# Patient Record
Sex: Male | Born: 1990
Health system: Southern US, Community
[De-identification: ages and names within clinical notes are randomized; demographics above are authoritative.]

## PROBLEM LIST (undated history)

## (undated) DIAGNOSIS — R569 Unspecified convulsions: Secondary | ICD-10-CM

---

## 2012-05-24 ENCOUNTER — Other Ambulatory Visit: Payer: Self-pay | Admitting: Neurology

## 2012-05-24 DIAGNOSIS — R569 Unspecified convulsions: Secondary | ICD-10-CM

## 2012-05-30 ENCOUNTER — Ambulatory Visit (HOSPITAL_COMMUNITY)
Admission: RE | Admit: 2012-05-30 | Discharge: 2012-05-30 | Disposition: A | Discharge status: Home / Self Care | Payer: BC Managed Care – PPO | Source: Ambulatory Visit | Attending: Neurology | Admitting: Neurology

## 2012-05-30 DIAGNOSIS — R569 Unspecified convulsions: Secondary | ICD-10-CM | POA: Insufficient documentation

## 2012-05-30 DIAGNOSIS — J329 Chronic sinusitis, unspecified: Secondary | ICD-10-CM | POA: Insufficient documentation

## 2012-05-30 MED ORDER — GADOBENATE DIMEGLUMINE 529 MG/ML IV SOLN: 15.0000 mL | Freq: Once | INTRAVENOUS | Status: AC | PRN

## 2020-03-05 ENCOUNTER — Inpatient Hospital Stay (HOSPITAL_COMMUNITY)
Admission: EM | Admit: 2020-03-05 | Discharge: 2020-03-09 | DRG: 100 | Disposition: A | Discharge status: Home / Self Care | Payer: Self-pay | Attending: Family Medicine | Admitting: Family Medicine

## 2020-03-05 ENCOUNTER — Other Ambulatory Visit: Payer: Self-pay

## 2020-03-05 ENCOUNTER — Emergency Department (HOSPITAL_COMMUNITY): Payer: Self-pay

## 2020-03-05 ENCOUNTER — Encounter (HOSPITAL_COMMUNITY): Payer: Self-pay

## 2020-03-05 DIAGNOSIS — S0081XA Abrasion of other part of head, initial encounter: Secondary | ICD-10-CM | POA: Diagnosis present

## 2020-03-05 DIAGNOSIS — R809 Proteinuria, unspecified: Secondary | ICD-10-CM | POA: Diagnosis present

## 2020-03-05 DIAGNOSIS — G40409 Other generalized epilepsy and epileptic syndromes, not intractable, without status epilepticus: Secondary | ICD-10-CM | POA: Diagnosis present

## 2020-03-05 DIAGNOSIS — Z820 Family history of epilepsy and other diseases of the nervous system: Secondary | ICD-10-CM

## 2020-03-05 DIAGNOSIS — D72825 Bandemia: Secondary | ICD-10-CM | POA: Diagnosis present

## 2020-03-05 DIAGNOSIS — D582 Other hemoglobinopathies: Secondary | ICD-10-CM | POA: Diagnosis present

## 2020-03-05 DIAGNOSIS — N182 Chronic kidney disease, stage 2 (mild): Secondary | ICD-10-CM | POA: Diagnosis present

## 2020-03-05 DIAGNOSIS — E860 Dehydration: Secondary | ICD-10-CM | POA: Diagnosis present

## 2020-03-05 DIAGNOSIS — D75839 Thrombocytosis, unspecified: Secondary | ICD-10-CM | POA: Diagnosis present

## 2020-03-05 DIAGNOSIS — M6282 Rhabdomyolysis: Secondary | ICD-10-CM | POA: Diagnosis present

## 2020-03-05 DIAGNOSIS — Z20822 Contact with and (suspected) exposure to covid-19: Secondary | ICD-10-CM | POA: Diagnosis present

## 2020-03-05 DIAGNOSIS — N170 Acute kidney failure with tubular necrosis: Secondary | ICD-10-CM | POA: Diagnosis present

## 2020-03-05 DIAGNOSIS — R17 Unspecified jaundice: Secondary | ICD-10-CM | POA: Diagnosis present

## 2020-03-05 DIAGNOSIS — R000 Tachycardia, unspecified: Secondary | ICD-10-CM | POA: Diagnosis present

## 2020-03-05 DIAGNOSIS — R4182 Altered mental status, unspecified: Secondary | ICD-10-CM

## 2020-03-05 DIAGNOSIS — E878 Other disorders of electrolyte and fluid balance, not elsewhere classified: Secondary | ICD-10-CM | POA: Diagnosis present

## 2020-03-05 DIAGNOSIS — G40909 Epilepsy, unspecified, not intractable, without status epilepticus: Principal | ICD-10-CM | POA: Diagnosis present

## 2020-03-05 DIAGNOSIS — E8729 Other acidosis: Secondary | ICD-10-CM | POA: Diagnosis present

## 2020-03-05 DIAGNOSIS — R739 Hyperglycemia, unspecified: Secondary | ICD-10-CM | POA: Diagnosis not present

## 2020-03-05 DIAGNOSIS — R569 Unspecified convulsions: Secondary | ICD-10-CM

## 2020-03-05 DIAGNOSIS — E872 Acidosis: Secondary | ICD-10-CM | POA: Diagnosis present

## 2020-03-05 DIAGNOSIS — D473 Essential (hemorrhagic) thrombocythemia: Secondary | ICD-10-CM | POA: Diagnosis present

## 2020-03-05 DIAGNOSIS — N179 Acute kidney failure, unspecified: Secondary | ICD-10-CM

## 2020-03-05 DIAGNOSIS — R319 Hematuria, unspecified: Secondary | ICD-10-CM | POA: Diagnosis present

## 2020-03-05 DIAGNOSIS — Z23 Encounter for immunization: Secondary | ICD-10-CM

## 2020-03-05 DIAGNOSIS — T07XXXA Unspecified multiple injuries, initial encounter: Secondary | ICD-10-CM | POA: Diagnosis present

## 2020-03-05 DIAGNOSIS — D72829 Elevated white blood cell count, unspecified: Secondary | ICD-10-CM | POA: Diagnosis present

## 2020-03-05 HISTORY — DX: Unspecified convulsions: R56.9

## 2020-03-05 LAB — BASIC METABOLIC PANEL
Anion gap: 25 — ABNORMAL HIGH (ref 5–15)
BUN: 9 mg/dL (ref 6–20)
CO2: 9 mmol/L — ABNORMAL LOW (ref 22–32)
Calcium: 9.4 mg/dL (ref 8.9–10.3)
Chloride: 107 mmol/L (ref 98–111)
Creatinine, Ser: 1.49 mg/dL — ABNORMAL HIGH (ref 0.61–1.24)
GFR calc Af Amer: >60 mL/min (ref >60)
GFR calc non Af Amer: >60 mL/min (ref >60)
Glucose, Bld: 152 mg/dL — ABNORMAL HIGH (ref 70–99)
Potassium: 4.1 mmol/L (ref 3.5–5.1)
Sodium: 141 mmol/L (ref 135–145)

## 2020-03-05 LAB — URINALYSIS, ROUTINE W REFLEX MICROSCOPIC
Bacteria, UA: NONE SEEN
Bilirubin Urine: NEGATIVE
Glucose, UA: 50 mg/dL — AB
Ketones, ur: NEGATIVE mg/dL
Leukocytes,Ua: NEGATIVE
Nitrite: NEGATIVE
Protein, ur: 100 mg/dL — AB
Specific Gravity, Urine: 1.015 (ref 1.005–1.030)
pH: 5 (ref 5.0–8.0)

## 2020-03-05 LAB — RAPID URINE DRUG SCREEN, HOSP PERFORMED
Amphetamines: NOT DETECTED
Barbiturates: NOT DETECTED
Benzodiazepines: NOT DETECTED
Cocaine: NOT DETECTED
Opiates: NOT DETECTED
Tetrahydrocannabinol: POSITIVE — AB

## 2020-03-05 LAB — CBG MONITORING, ED
Glucose-Capillary: 139 mg/dL — ABNORMAL HIGH (ref 70–99)
Glucose-Capillary: 99 mg/dL (ref 70–99)

## 2020-03-05 LAB — CBC WITH DIFFERENTIAL/PLATELET
Abs Immature Granulocytes: 0.19 10*3/uL — ABNORMAL HIGH (ref 0.00–0.07)
Basophils Absolute: 0 10*3/uL (ref 0.0–0.1)
Basophils Relative: 0 %
Eosinophils Absolute: 0.1 10*3/uL (ref 0.0–0.5)
Eosinophils Relative: 1 %
HCT: 54.4 % — ABNORMAL HIGH (ref 39.0–52.0)
Hemoglobin: 17.3 g/dL — ABNORMAL HIGH (ref 13.0–17.0)
Immature Granulocytes: 2 %
Lymphocytes Relative: 15 %
Lymphs Abs: 1.9 10*3/uL (ref 0.7–4.0)
MCH: 29.5 pg (ref 26.0–34.0)
MCHC: 31.8 g/dL (ref 30.0–36.0)
MCV: 92.8 fL (ref 80.0–100.0)
Monocytes Absolute: 0.9 10*3/uL (ref 0.1–1.0)
Monocytes Relative: 7 %
Neutro Abs: 9.5 10*3/uL — ABNORMAL HIGH (ref 1.7–7.7)
Neutrophils Relative %: 75 %
Platelets: 429 10*3/uL — ABNORMAL HIGH (ref 150–400)
RBC: 5.86 MIL/uL — ABNORMAL HIGH (ref 4.22–5.81)
RDW: 11.9 % (ref 11.5–15.5)
WBC: 12.6 10*3/uL — ABNORMAL HIGH (ref 4.0–10.5)
nRBC: 0 % (ref 0.0–0.2)

## 2020-03-05 LAB — RESPIRATORY PANEL BY RT PCR (FLU A&B, COVID)
Influenza A by PCR: NEGATIVE
Influenza B by PCR: NEGATIVE
SARS Coronavirus 2 by RT PCR: NEGATIVE

## 2020-03-05 LAB — CK: Total CK: 302 U/L (ref 49–397)

## 2020-03-05 LAB — ETHANOL: Alcohol, Ethyl (B): <10 mg/dL (ref <10)

## 2020-03-05 LAB — HIV ANTIBODY (ROUTINE TESTING W REFLEX): HIV Screen 4th Generation wRfx: NONREACTIVE

## 2020-03-05 MED ORDER — LORAZEPAM 2 MG/ML IJ SOLN
INTRAMUSCULAR | Status: AC
  Filled 2020-03-05: qty 1

## 2020-03-05 MED ORDER — SODIUM CHLORIDE 0.9 % IV SOLN: INTRAVENOUS | Status: DC

## 2020-03-05 MED ORDER — TETANUS-DIPHTHERIA TOXOIDS TD 5-2 LFU IM INJ: 0.5000 mL | INJECTION | Freq: Once | INTRAMUSCULAR | Status: DC

## 2020-03-05 MED ORDER — LORAZEPAM 2 MG/ML IJ SOLN: 2.0000 mg | INTRAMUSCULAR | Status: DC | PRN

## 2020-03-05 MED ORDER — ENOXAPARIN SODIUM 40 MG/0.4ML ~~LOC~~ SOLN: 40.0000 mg | SUBCUTANEOUS | Status: DC

## 2020-03-05 MED ORDER — LEVETIRACETAM 500 MG PO TABS
500.0000 mg | ORAL_TABLET | Freq: Two times a day (BID) | ORAL | Status: DC
  Administered 2020-03-05 – 2020-03-09 (×8): 500 mg via ORAL
  Filled 2020-03-05 (×8): qty 1

## 2020-03-05 MED ORDER — LORAZEPAM 2 MG/ML IJ SOLN
INTRAMUSCULAR | Status: AC
  Administered 2020-03-05: 11:00:00 2 mg
  Filled 2020-03-05: qty 1

## 2020-03-05 MED ORDER — TETANUS-DIPHTH-ACELL PERTUSSIS 5-2.5-18.5 LF-MCG/0.5 IM SUSP
0.5000 mL | Freq: Once | INTRAMUSCULAR | Status: AC
  Administered 2020-03-05: 0.5 mL via INTRAMUSCULAR
  Filled 2020-03-05: qty 0.5

## 2020-03-05 MED ORDER — ACETAMINOPHEN 650 MG RE SUPP: 650.0000 mg | Freq: Four times a day (QID) | RECTAL | Status: DC | PRN

## 2020-03-05 MED ORDER — LEVETIRACETAM IN NACL 1000 MG/100ML IV SOLN
1000.0000 mg | Freq: Once | INTRAVENOUS | Status: AC
  Administered 2020-03-05: 1000 mg via INTRAVENOUS
  Filled 2020-03-05: qty 100

## 2020-03-05 MED ORDER — SODIUM CHLORIDE 0.9 % IV BOLUS: 500.0000 mL | Freq: Once | INTRAVENOUS | Status: DC

## 2020-03-05 MED ORDER — ACETAMINOPHEN 325 MG PO TABS: 650.0000 mg | ORAL_TABLET | Freq: Four times a day (QID) | ORAL | Status: DC | PRN

## 2020-03-05 MED ORDER — SODIUM CHLORIDE 0.9 % IV BOLUS
1000.0000 mL | Freq: Once | INTRAVENOUS | Status: AC
  Administered 2020-03-05: 1000 mL via INTRAVENOUS

## 2020-03-05 MED ORDER — SODIUM CHLORIDE 0.9 % IV SOLN: INTRAVENOUS | Status: AC

## 2020-03-05 NOTE — ED Notes (Signed)
Pt drank a little milk, laid flat and projectile vomited on bed while this RN was in room. Most caught in emesis bag. Pt threw up 3 times and now stopped and is resting comfortably. Bed linen changed

## 2020-03-05 NOTE — ED Provider Notes (Signed)
[facility] EMERGENCY DEPARTMENT Provider Note   CSN: [id number] Arrival date & time: 03/05/20  1019     History Chief Complaint  Patient presents with  . Seizures    Victor Franklin is a 29 y.o. male.  HPI 29 year old male with a previous history of seizures but no other medical history presents to the ER via EMS after a witnessed seizure while walking on the street.  History provided by patient who is alert and oriented.  He states he was walking in the street alone, when he put his head up against the wall because he started to feel unwell.  EMS reported that the patient fell and hit the left side of his head.  Seizure was witnessed by a bystander who called EMS.  Unclear if the seizure was tonic-clonic.  Seizure lasted approximately less than 1 minute per the witness.  EMS brought the patient in a c-collar.  Patient states that he is not on any medications for seizures and that his last seizure was approximately 2 years ago.  He is not aware as to why he is not taking any medications for this.  He denies having any neck pain, headaches, fevers, back pain at this time.  Denies use of drugs or alcohol abuse. Denies post-ictal stage. Unaware of what triggers his seizures. No history of CVA, developmental delay. He does have a family history of seizures.  Endorses drinking alcohol.  He has multiple abrasions to the left side of his face.   After finishing interviewing the patient, as I was walking out of the room, the nurse who was putting in an IV  called me to bedside as the patient began to have a tonic-clonic seizure.  Patient was given 2 mg of Ativan via IV and seizure activity ceased.  Patient was diaphoretic, tachycardic, post ictal, immobilized in [place] collar.  He was given another 2 mg of Ativan, 1 mg of Keppra and UA, alcohol level, drug screen, BMP and sent to CT scan.   After the witnessed seizure, his mother is at bedside who provided some additional  information.  First onset of seizures was at age 89.  He was taken for neurology work-up, MRI performed in 2013 which was negative.  She reports that it was decided that he would not start any medications. She states her sister has had one seizure in her entire life.       Past Medical History:  Diagnosis Date  . Seizures ([place])     There are no problems to display for this patient.   History reviewed. No pertinent surgical history.     No family history on file.  Social History   Tobacco Use  . Smoking status: Never Smoker  . Smokeless tobacco: Never Used  Substance Use Topics  . Alcohol use: Not Currently  . Drug use: Never    Home Medications Prior to Admission medications   Not on File    Allergies    Patient has no known allergies.  Review of Systems   Review of Systems  Constitutional: Negative for chills, diaphoresis, fatigue and fever.  HENT: Negative for ear pain, mouth sores, sore throat and trouble swallowing.   Eyes: Negative for pain.  Respiratory: Negative for cough, chest tightness and shortness of breath.   Cardiovascular: Negative for chest pain and palpitations.  Gastrointestinal: Negative for abdominal pain, diarrhea, nausea and vomiting.  Genitourinary: Negative for enuresis.  Musculoskeletal: Negative for back pain, joint swelling, myalgias, neck pain  and neck stiffness.  Neurological: Positive for seizures. Negative for dizziness, weakness, light-headedness and headaches.  Psychiatric/Behavioral: Negative for confusion.  All other systems reviewed and are negative.   Physical Exam Updated Vital Signs BP 108/77   Pulse (!) 103   Temp 98 F (36.7 C) (Oral)   Resp (!) 21   Ht 5\' 7"  (1.702 m)   Wt 81.6 kg   SpO2 98%   BMI 28.19 kg/m   Physical Exam Vitals reviewed.  Constitutional:      Appearance: Normal appearance.  HENT:     Head: Normocephalic and atraumatic.     Mouth/Throat:     Comments: Bilateral bite marks on lateral  aspects of the tongue Eyes:     General:        Right eye: No discharge.        Left eye: No discharge.     Extraocular Movements: Extraocular movements intact.     Conjunctiva/sclera: Conjunctivae normal.  Cardiovascular:     Rate and Rhythm: Normal rate and regular rhythm.  Pulmonary:     Effort: Pulmonary effort is normal.     Breath sounds: Normal breath sounds.  Abdominal:     General: Abdomen is flat.     Palpations: Abdomen is soft.  Musculoskeletal:        General: No swelling, tenderness or signs of injury. Normal range of motion.     Cervical back: Normal range of motion and neck supple. No tenderness.  Skin:    Findings: Erythema and lesion present.     Comments: Multiple superficial abrasions to the left side of face, more specifically eyebrow, cheek  Neurological:     General: No focal deficit present.     Mental Status: He is alert and oriented to person, place, and time.     Cranial Nerves: No cranial nerve deficit.     Sensory: No sensory deficit.     Motor: No weakness.     Comments: Strength and sensation intact in upper and lower extremities. Cranial nerves intact. A&O x4  Psychiatric:        Mood and Affect: Mood normal.        Behavior: Behavior normal.     ED Results / Procedures / Treatments   Labs (all labs ordered are listed, but only abnormal results are displayed) Labs Reviewed  BASIC METABOLIC PANEL - Abnormal; Notable for the following components:      Result Value   CO2 9 (*)    Glucose, Bld 152 (*)    Creatinine, Ser 1.49 (*)    Anion gap 25 (*)    All other components within normal limits  CBC WITH DIFFERENTIAL/PLATELET - Abnormal; Notable for the following components:   WBC 12.6 (*)    RBC 5.86 (*)    Hemoglobin 17.3 (*)    HCT 54.4 (*)    Platelets 429 (*)    Neutro Abs 9.5 (*)    Abs Immature Granulocytes 0.19 (*)    All other components within normal limits  CBG MONITORING, ED - Abnormal; Notable for the following components:     Glucose-Capillary 139 (*)    All other components within normal limits  RESPIRATORY PANEL BY RT PCR (FLU A&B, COVID)  ETHANOL  RAPID URINE DRUG SCREEN, HOSP PERFORMED  URINALYSIS, ROUTINE W REFLEX MICROSCOPIC    EKG EKG Interpretation  Date/Time:  Tuesday March 05 2020 11:13:14 EDT Ventricular Rate:  118 PR Interval:    QRS Duration: 99 QT Interval:  339 QTC Calculation: 475 R Axis:   81 Text Interpretation: Sinus tachycardia Borderline prolonged QT interval Baseline wander in lead(s) II III aVL aVF V1 V2 V4 V6 No old tracing to compare Confirmed by Noemi Chapel [id number]) on 03/05/2020 11:45:08 AM   Radiology CT Head Wo Contrast  Result Date: 03/05/2020 CLINICAL DATA:  Acute seizure.  History of trauma. EXAM: CT HEAD WITHOUT CONTRAST CT CERVICAL SPINE WITHOUT CONTRAST TECHNIQUE: Multidetector CT imaging of the head and cervical spine was performed following the standard protocol without intravenous contrast. Multiplanar CT image reconstructions of the cervical spine were also generated. COMPARISON:  Head CT report 03/18/2013 FINDINGS: CT HEAD FINDINGS Brain: No evidence of acute infarction, hemorrhage, hydrocephalus, extra-axial collection or mass lesion/mass effect. Vascular: No hyperdense vessel or unexpected calcification. Skull: Normal. Negative for fracture or focal lesion. Sinuses/Orbits: No acute finding. CT CERVICAL SPINE FINDINGS Alignment: Normal. Skull base and vertebrae: No acute fracture. No primary bone lesion or focal pathologic process. Soft tissues and spinal canal: No prevertebral fluid or swelling. No visible canal hematoma. Disc levels:  No degenerative changes Upper chest: Negative Other: Carious left posterior molar with periapical erosion. IMPRESSION: No evidence of intracranial or cervical spine injury. Electronically Signed   By: Monte Fantasia M.D.   On: 03/05/2020 11:40   CT Cervical Spine Wo Contrast  Result Date: 03/05/2020 CLINICAL DATA:  Acute seizure.   History of trauma. EXAM: CT HEAD WITHOUT CONTRAST CT CERVICAL SPINE WITHOUT CONTRAST TECHNIQUE: Multidetector CT imaging of the head and cervical spine was performed following the standard protocol without intravenous contrast. Multiplanar CT image reconstructions of the cervical spine were also generated. COMPARISON:  Head CT report 03/18/2013 FINDINGS: CT HEAD FINDINGS Brain: No evidence of acute infarction, hemorrhage, hydrocephalus, extra-axial collection or mass lesion/mass effect. Vascular: No hyperdense vessel or unexpected calcification. Skull: Normal. Negative for fracture or focal lesion. Sinuses/Orbits: No acute finding. CT CERVICAL SPINE FINDINGS Alignment: Normal. Skull base and vertebrae: No acute fracture. No primary bone lesion or focal pathologic process. Soft tissues and spinal canal: No prevertebral fluid or swelling. No visible canal hematoma. Disc levels:  No degenerative changes Upper chest: Negative Other: Carious left posterior molar with periapical erosion. IMPRESSION: No evidence of intracranial or cervical spine injury. Electronically Signed   By: Monte Fantasia M.D.   On: 03/05/2020 11:40    Procedures Procedures (including critical care time)  Medications Ordered in ED Medications  LORazepam (ATIVAN) 2 MG/ML injection (has no administration in time range)  LORazepam (ATIVAN) 2 MG/ML injection (2 mg  Given 03/05/20 1100)  levETIRAcetam (KEPPRA) IVPB 1000 mg/100 mL premix (0 mg Intravenous Stopped 03/05/20 1143)    ED Course  I have reviewed the triage vital signs and the nursing notes.  Pertinent labs & imaging results that were available during my care of the patient were reviewed by me and considered in my medical decision making (see chart for details).  Clinical Course as of Mar 05 1430  Tue Mar 05, 2020  [id number] Basic metabolic panel [MB]    Clinical Course User Index [MB] Lyndel Safe   MDM Rules/Calculators/A&P                     29 year old male with  a history of seizures w/ no treatment presents via EMS via for a witnessed seizure and subsequently having a additional tonic-clonic seizure in the ER with a prolonged postictal state. On initial physical exam, patient was alert and oriented, able to  clearly recall the events leading up to and after the seizure.  He denied a postictal state.  Denied any neck pain, back pain, headaches, fevers.  Reported that he was not on any medications.  He did have several facial abrasions and bite marks on the lateral side of the tongue bilaterally, but otherwise physical exam was unremarkable.   I personally witnessed the second seizure, which was of general tonic-clonic nature.  To milligrams of Ativan was given to the patient, after which the seizure ceased.  Patient was immediately in a postictal state, and I called Dr. Sabra Heck to bedside.  He was placed in a [place] collar and sent for CT along with an additional 2 mg of Ativan and and 1 g of Keppra infusion.  Labs ordered including BMP, CBC, ethanol, urine drug screen, urinalysis.    CT negative for intracranial hemorrhage or fracture.  BMP with mildly elevated creatinine, no other electrolyte abnormalities.  CBC with mildly elevated WBCs, RBCs, hemoglobin, hematocrit, likely consistent in the setting of a seizure but not clinically significant. CBG 139, low concern for hypoglycemic seizure. No signs of meningitis. Pt referred that he is not on any medications, so concern for  medication induced seizure low.  Urinalysis, rapid urine drug screen, EtOH pending.   Patient has remained post ictal throughout course of ED stay.  Mother at bedside.  Has intermittent bouts of agitation.  Given multiple seizures in short succession, prolonged postictal state, and history of treatment with no prior treatment, I suspect he will need admission for further monitoring/medication management.  We will consult neuro and hospitalist group.  Neuro consulted, recommends  outpatient workup. Given continuous post ictal state and no prior treatment,   Dr. Sabra Heck consulted Family Medicine who will admit the patient for further observation and management of antiseizure medications to therapeutic levels.  Final Clinical Impression(s) / ED Diagnoses Final diagnoses:  Seizures ([place])  Altered mental status, unspecified altered mental status type    Rx / DC Orders ED Discharge Orders    None       Lyndel Safe 03/05/20 1434    Noemi Chapel, MD 03/06/20 [phone]

## 2020-03-05 NOTE — ED Notes (Signed)
Pt seen walking around room. Pt unhooked himself from monitor, took out R hand IV. Bleeding now controlled. Gave pt water and soda. No emesis or nausea reported.

## 2020-03-05 NOTE — H&P (Signed)
[facility] Admission History and Physical Service Pager: [phone]  Patient name: Victor Franklin Medical record number: [id number] Date of birth: 10-Dec-1990 Age: 29 y.o. Gender: male  Primary Care Provider: Caryl Bis, MD Consultants: None Code Status: Full Preferred Emergency Contact: Victor Franklin (mom) [phone]  Chief Complaint: Seizure, Fall  Assessment and Plan: Victor Franklin is a 29 y.o. male presenting with seizure activity. PMH is significant for tobacco use disorder, chronic marijuana smoker.   Seizure Disorder Patient was in normal state of health until earlier today when patient experienced multiple witnessed tonic clonic seizures both at work with fall that resulted in striking his head on an unknown object and another witnessed seizure during admission to ED. He was give 2mg  Ativan IV x2 and 1g of IV Keppra. Per patient, he has a history of seizure disorder that has never been treated. He had work up obtained in 2013 and normal MRI at that time and was not placed on any medications. Patient experienced loss of consciousness during these episodes and a post-ictal state that was slowly returning to baseline on admission. CT head and cervical spine was negative for hemorrhage or fracture. CBG and electrolytes WNL. Mild leukocytosis however given lack of other infectious symptoms, negative UA likely secondary to seizure. UDS positive for THC. EKG w/ sinus tachycardia and wandering baseline. Suspect seizure today was break through given lack of treatment for known seizure disorder. There are no other known triggers at this time but will continue to consider other causes. Neuro was consulted in the ED and recommended outpatient follow up. Will bring him in for observation overnight due to continued slow return to baseline and will continue Keppra PO. -Admit to FPTS, obs. Attending Dr. Sherren Mocha McDiarmid  -PO Keppra 500mg  BID starting tomorrow, if seizure over  night can give 500mg  PO/IV -Tylenol PRN -f/u ethanol lvl -f/u AM CBC, BMP -Seizure precautions -cardiac monitoring, pulse ox -vital sign per protocol  -repeat EKG am  Elevated glucose CBG 152 on admission. Now 99. Likely due to acute state of illness but will continue to monitor on AM BMP. -Monitor on AM BMP   Tobacco use disorder/ +THC/ Ethanol ingestion Chew tobacco and endorses daily marijuana use multiple times a day. Nicotine patch offered, patient declined at this time. Reported last drink was this past weekend and drinks socially.  -f/u Ethanol lvl -monitor for signs of alcohol withdrawal -consider re offering nicotine patch  FEN/GI: Regular diet Prophylaxis: Lovenox  Disposition: Med-Surg, OBS  History of Present Illness:  Victor Franklin is a 29 y.o. male presenting after experiencing a witnessed seizure while at work. Patient was working when he all of a sudden fell over against a wall. Per EMS/ED provider, seizure was witnessed by a bystander who called EMS. When he fell, he hit the left side of his forehead. He was noted to have an additional seizure while in the emergency room. The last thing he remembers prior to the seizure was "sitting down", then he remembers being surrounded by everyone. He denies any infectious symptoms prior to today. Denies any past medical history other than seizures - He has a history of seizures, first occurred in 2013. He had his last seizure in 2019. He had some work up back in 2013 at [facility] including [place]. He has never been on medications.  Last MRI in 2013 was normal.   Prior to admission patient had a witnessed tonic-clonic seizure by ED provider. He was given 2mg  of Ativan  IV which resolved the seizure. He was given 2mg  Ativan and 1g of Keppra. Neurology was consulted and did not feel admission was necessary given history and prior MRI that was normal. Recommended outpatient follow up.   Labs: CT head and cervical spine negative for  intracranial hemorrhage or fracture. BMP with normal electrolytes,. CBC with mild leukocytosis of 12.6, Hgb 17.3, Plt 429. Negative COVID and Flu, UA with mod Hgb and 100 protein, otherwise normal. UDS positive for THC.  CBG on admission 139.   Denies being on any medications. Last saw his PCP in 2019.   Alcohol: drinks on the weekends, last drink last weekend Tobacco: chews tobacco  Drugs: Marijuana - multiple times per day  No allergies.     Review Of Systems: Per HPI with the following additions:   Review of Systems  Constitutional: Negative for fever.  HENT: Negative for congestion.   Respiratory: Negative for cough and shortness of breath.   Gastrointestinal: Negative for abdominal pain, constipation, diarrhea, nausea and vomiting.  Genitourinary: Negative for dysuria.  Musculoskeletal: Positive for falls. Negative for back pain, joint pain and neck pain.  Neurological: Positive for seizures, loss of consciousness and weakness. Negative for focal weakness and headaches.  Psychiatric/Behavioral: Positive for substance abuse.    There are no problems to display for this patient.   Past Medical History: Past Medical History:  Diagnosis Date  . Seizures ([place])     Past Surgical History: History reviewed. No pertinent surgical history.  Social History: Social History   Tobacco Use  . Smoking status: Never Smoker  . Smokeless tobacco: Never Used  Substance Use Topics  . Alcohol use: Not Currently  . Drug use: Never   Additional social history: See above  Please also refer to relevant sections of EMR.  Family History: No family history on file.  Allergies and Medications: No Known Allergies No current facility-administered medications on file prior to encounter.   No current outpatient medications on file prior to encounter.    Objective: BP 108/77   Pulse (!) 103   Temp 98 F (36.7 C) (Oral)   Resp (!) 21   Ht 5\' 7"  (1.702 m)   Wt 81.6 kg   SpO2 98%    BMI 28.19 kg/m   Exam: General: Appears tired and lethargic. No acute distress. Appears older than stated age. Eyes: PERRL. No conjunctivae ENTM: Normal orophranyx. No missing teeth Neck: No visible thyroid enlargement Cardiovascular: RRR, no murmurs Respiratory: CTAB Gastrointestinal: soft, flat, no organomegaly, non tender, +BS MSK: 4/5 strength in U+L ext. Derm: Multiple abrasions on left lateral face Neuro: Alert but sluggish. Orient to person but not to place or time. CN II-XII grossly intact. No focal deficits.  Psych: Flat affect  Labs and Imaging: CBC BMET  Recent Labs  Lab 03/05/20 1145  WBC 12.6*  HGB 17.3*  HCT 54.4*  PLT 429*   Recent Labs  Lab 03/05/20 1145  NA 141  K 4.1  CL 107  CO2 9*  BUN 9  CREATININE 1.49*  GLUCOSE 152*  CALCIUM 9.4    UA w/ proteinuria, hgb, and glucose UDS +THC COVID neg CBG 139  EKG: Sinus tach: wandering baseline  CT HEAD WITHOUT CONTRAST CT CERVICAL SPINE WITHOUT CONTRAST COMPARISON:  Head CT report 03/18/2013 IMPRESSION: No evidence of intracranial or cervical spine injury.   Gerlene Fee, DO 03/05/2020, 1:54 PM PGY-1, [place] Intern pager: [phone], text pages welcome

## 2020-03-05 NOTE — ED Triage Notes (Addendum)
Pt brought in by EMS; pt walking down sidewalk, fell to concrete, had a full body seizure lasting less than 1 minute, witnessed by friend; hx seizures; not on seizure medication; alert to self and place at this time; abrasions to left head and face  130/76 HR 96 97% RA CBG 116

## 2020-03-05 NOTE — ED Provider Notes (Signed)
Medical screening examination/treatment/procedure(s) were conducted as a shared visit with non-physician practitioner(s) and myself.  I personally evaluated the patient during the encounter.  Clinical Impression:   Final diagnoses:  Seizures ([place])  Altered mental status, unspecified altered mental status type   This patient is an ill-appearing 29 year old male presenting with seizures.  Evidently there was a witnessed prehospital seizure, he had a fall, he struck the left side of his head, paramedics immobilized the patient in a cervical collar and transported him to the hospital.  On arrival the patient had normal mental status but very quickly went back into tonic-clonic seizure activity, 2 mg of Ativan were given IV with cessation of seizure activity.  On my exam the patient is diaphoretic, tachycardic and has evidence of head trauma on the left as well as tongue biting.  Immobilized in a [place] collar, sent for CT scan of the head and cervical spine, given more Ativan, 1 g of Keppra, metabolic panel will be ordered, alcohol level, drug screen.  The patient will be placed on a cardiac monitor, he is critically ill with intermittent ongoing seizures.  This patient needs to be admitted to the hospital for prolonged altered mental status, multiple recurrent seizures, after getting Keppra and Ativan the patient had no further seizures though he continued to be somnolent, CT scans negative for acute findings, neurology consulted, recommends therapy with antiseizure medications but due to prolonged postictal phase the patient will be admitted to the medical service  .Critical Care Performed by: Noemi Chapel, MD Authorized by: Noemi Chapel, MD   Critical care provider statement:    Critical care time (minutes):  35   Critical care time was exclusive of:  Separately billable procedures and treating other patients and teaching time   Critical care was necessary to treat or prevent imminent or  life-threatening deterioration of the following conditions:  CNS failure or compromise   Critical care was time spent personally by me on the following activities:  Blood draw for specimens, development of treatment plan with patient or surrogate, discussions with consultants, evaluation of patient's response to treatment, examination of patient, obtaining history from patient or surrogate, ordering and performing treatments and interventions, ordering and review of laboratory studies, ordering and review of radiographic studies, pulse oximetry, re-evaluation of patient's condition and review of old charts      Noemi Chapel, MD 03/06/20 [phone]

## 2020-03-06 ENCOUNTER — Encounter (HOSPITAL_COMMUNITY): Payer: Self-pay | Admitting: Family Medicine

## 2020-03-06 ENCOUNTER — Inpatient Hospital Stay (HOSPITAL_COMMUNITY): Payer: Self-pay

## 2020-03-06 DIAGNOSIS — R809 Proteinuria, unspecified: Secondary | ICD-10-CM | POA: Diagnosis present

## 2020-03-06 DIAGNOSIS — D473 Essential (hemorrhagic) thrombocythemia: Secondary | ICD-10-CM

## 2020-03-06 DIAGNOSIS — R17 Unspecified jaundice: Secondary | ICD-10-CM | POA: Diagnosis present

## 2020-03-06 DIAGNOSIS — R319 Hematuria, unspecified: Secondary | ICD-10-CM

## 2020-03-06 DIAGNOSIS — R569 Unspecified convulsions: Secondary | ICD-10-CM

## 2020-03-06 DIAGNOSIS — N179 Acute kidney failure, unspecified: Secondary | ICD-10-CM

## 2020-03-06 DIAGNOSIS — G40409 Other generalized epilepsy and epileptic syndromes, not intractable, without status epilepticus: Secondary | ICD-10-CM

## 2020-03-06 HISTORY — DX: Hematuria, unspecified: R31.9

## 2020-03-06 LAB — CBC WITH DIFFERENTIAL/PLATELET
Abs Immature Granulocytes: 0.03 10*3/uL (ref 0.00–0.07)
Basophils Absolute: 0 10*3/uL (ref 0.0–0.1)
Basophils Relative: 0 %
Eosinophils Absolute: 0 10*3/uL (ref 0.0–0.5)
Eosinophils Relative: 0 %
HCT: 45.4 % (ref 39.0–52.0)
Hemoglobin: 15.3 g/dL (ref 13.0–17.0)
Immature Granulocytes: 0 %
Lymphocytes Relative: 9 %
Lymphs Abs: 1 10*3/uL (ref 0.7–4.0)
MCH: 29.8 pg (ref 26.0–34.0)
MCHC: 33.7 g/dL (ref 30.0–36.0)
MCV: 88.3 fL (ref 80.0–100.0)
Monocytes Absolute: 1.3 10*3/uL — ABNORMAL HIGH (ref 0.1–1.0)
Monocytes Relative: 12 %
Neutro Abs: 8.7 10*3/uL — ABNORMAL HIGH (ref 1.7–7.7)
Neutrophils Relative %: 79 %
Platelets: 258 10*3/uL (ref 150–400)
RBC: 5.14 MIL/uL (ref 4.22–5.81)
RDW: 12 % (ref 11.5–15.5)
WBC: 11 10*3/uL — ABNORMAL HIGH (ref 4.0–10.5)
nRBC: 0 % (ref 0.0–0.2)

## 2020-03-06 LAB — BASIC METABOLIC PANEL
Anion gap: 9 (ref 5–15)
Anion gap: 10 (ref 5–15)
BUN: 14 mg/dL (ref 6–20)
BUN: 16 mg/dL (ref 6–20)
CO2: 22 mmol/L (ref 22–32)
CO2: 20 mmol/L — ABNORMAL LOW (ref 22–32)
Calcium: 8.8 mg/dL — ABNORMAL LOW (ref 8.9–10.3)
Calcium: 8.2 mg/dL — ABNORMAL LOW (ref 8.9–10.3)
Chloride: 106 mmol/L (ref 98–111)
Chloride: 110 mmol/L (ref 98–111)
Creatinine, Ser: 2.9 mg/dL — ABNORMAL HIGH (ref 0.61–1.24)
Creatinine, Ser: 3.86 mg/dL — ABNORMAL HIGH (ref 0.61–1.24)
GFR calc Af Amer: 33 mL/min — ABNORMAL LOW (ref >60)
GFR calc Af Amer: 23 mL/min — ABNORMAL LOW (ref >60)
GFR calc non Af Amer: 29 mL/min — ABNORMAL LOW (ref >60)
GFR calc non Af Amer: 20 mL/min — ABNORMAL LOW (ref >60)
Glucose, Bld: 105 mg/dL — ABNORMAL HIGH (ref 70–99)
Glucose, Bld: 105 mg/dL — ABNORMAL HIGH (ref 70–99)
Potassium: 3.6 mmol/L (ref 3.5–5.1)
Potassium: 3.7 mmol/L (ref 3.5–5.1)
Sodium: 137 mmol/L (ref 135–145)
Sodium: 140 mmol/L (ref 135–145)

## 2020-03-06 LAB — CREATININE, URINE, RANDOM: Creatinine, Urine: 78.18 mg/dL

## 2020-03-06 LAB — HEPATIC FUNCTION PANEL
ALT: 29 U/L (ref 0–44)
AST: 35 U/L (ref 15–41)
Albumin: 3.7 g/dL (ref 3.5–5.0)
Alkaline Phosphatase: 79 U/L (ref 38–126)
Bilirubin, Direct: 0.3 mg/dL — ABNORMAL HIGH (ref 0.0–0.2)
Indirect Bilirubin: 1.8 mg/dL — ABNORMAL HIGH (ref 0.3–0.9)
Total Bilirubin: 2.1 mg/dL — ABNORMAL HIGH (ref 0.3–1.2)
Total Protein: 6.4 g/dL — ABNORMAL LOW (ref 6.5–8.1)

## 2020-03-06 LAB — CK
Total CK: 1663 U/L — ABNORMAL HIGH (ref 49–397)
Total CK: 1486 U/L — ABNORMAL HIGH (ref 49–397)

## 2020-03-06 LAB — SODIUM, URINE, RANDOM: Sodium, Ur: 152 mmol/L

## 2020-03-06 MED ORDER — HEPARIN SODIUM (PORCINE) 5000 UNIT/ML IJ SOLN
5000.0000 [IU] | Freq: Three times a day (TID) | INTRAMUSCULAR | Status: DC
  Administered 2020-03-06 – 2020-03-09 (×8): 5000 [IU] via SUBCUTANEOUS
  Filled 2020-03-06 (×8): qty 1

## 2020-03-06 MED ORDER — WHITE PETROLATUM EX OINT
TOPICAL_OINTMENT | Freq: Two times a day (BID) | CUTANEOUS | Status: DC
  Administered 2020-03-06 – 2020-03-07 (×3): 0.2 via TOPICAL
  Filled 2020-03-06 (×2): qty 28.35

## 2020-03-06 MED ORDER — SODIUM CHLORIDE 0.9 % IV BOLUS
1000.0000 mL | Freq: Once | INTRAVENOUS | Status: AC
  Administered 2020-03-06: 1000 mL via INTRAVENOUS

## 2020-03-06 MED ORDER — SODIUM CHLORIDE 0.9 % IV SOLN: INTRAVENOUS | Status: DC

## 2020-03-06 MED ORDER — SODIUM CHLORIDE 0.9 % IV SOLN: INTRAVENOUS | Status: AC

## 2020-03-06 NOTE — Progress Notes (Addendum)
Briefly spoke with Dr. Johnney Ou, nephrology, who recommended staying the course as is. Did suggest decreasing IVF, will go to maintenance at 125 mL/hour.   Additionally attempted to update mother on plan of action, however both numbers within his chart are unavailable.  Will clarify phone numbers with patient tomorrow.  Patriciaann Clan, DO

## 2020-03-06 NOTE — Progress Notes (Signed)
Noted afternoon creatinine increased to 3.8 from 2.9 this morning with downtrending of CK to 1486.  Evaluated patient at bedside, doing well and has no complaints.  Urinating as normal.  A/P: Nonoliguric acute renal injury: Suspect multifactorial 2/2 ATN with heme pigment/rhabdomyolysis with component of dehydration, however degree of renal injury does seem to be out of proportion in relation to CK elevation.  May have component of underlying renal disease with history of significant NSAID use.  FeNa 2.1%, suggesting intrinsic (urine studies collected prior to IVF initiation, calculated with presenting Cr 1.5).   Will stay the course with aggressive IVF. In addition, will obtain a renal ultrasound, P/CR ratio, and repeat urinalysis.  If becomes oliguric or creatinine uptrends in the morning, will likely consult nephrology for additional assistance.  Patriciaann Clan, DO

## 2020-03-06 NOTE — Progress Notes (Addendum)
Patient's creatinine markedly elevated from admission yesterday.  2.9 from 1.49.  Other electrolytes are within normal limits.  Patient also has bilirubinemia this morning concerning for muscle breakdown likely in the setting of seizures.   AKI Pre-renal versus intrinsic pigment associated. Prerenal etiology possible 2/2 dehydration; however BUN:Cr not reflective of this. Can also consider Keppra, though rare per literature review and pt received 1g. No other abnormalities concerning for severe kidney injury at this time. Continue to monitor. No urine micro from urinalysis yesterday.  - will repeat CK -obtain FeNa -continue strict IO  - 1 L bolus + 250 maintenance  -if no UOP x 4 hours, bladder scan -recheck BMP 1600   Wilber Oliphant, M.D.  7:05 AM 03/06/2020

## 2020-03-06 NOTE — Progress Notes (Signed)
Family Medicine Teaching Service Daily Progress Note Intern Pager: [phone]  Patient name: Victor Franklin Medical record number: [id number] Date of birth: [birth year] Age: 29 y.o. Gender: male  Primary Care Provider: Caryl Bis, MD Consultants: None Code Status: FULL  Pt Overview and Major Events to Date:  4/8 Admitted  Assessment and Plan: Mang Tailor is a 29 y.o. male presenting with seizure activity. PMH is significant for tobacco use disorder, chronic marijuana smoker.   Tonic Clonic convulsions  c/f Rhabdomyolysis  No reported seizures overnight. Patient only concern is back soreness. Likely from his fall. CK [id number] and elevated creatinine. Suspect rhabdomyolysis likely due to seizures.    -PO Keppra 500mg  BID -NS 250 mL mIVF; consider increasing with repeat CK -Tylenol PRN -f/u PM CK -f/u AM CBC, BMP -Seizure precautions -cardiac monitoring, pulse ox -vital sign per protocol  -f/u repeat EKG  AKI Cr 2.90. Admission was 1.49. FENa is 4.1% suggesting post-renal/obstruction which would be unusual for a male his age. More likely related to dehydration/hemoconcentration, starting keppra. Patient endorses NSAID use (up to 10 800mg  tablet/day). -Monitor on BMP -Avoid nephrotoxic drugs  Thrombocytosis. Resolved. Plt 258. Admission 429. Thought to be due to acute state of illness -Monitor AM CBC  Multiple abrasions of left of face and left pinna  -Needs Tdap booster -Vaseline  Elevated glucose CBG 152 on admission. Now 105. Likely due to acute state of illness but will continue to monitor on AM BMP. -Monitor on AM BMP   Tobacco use disorder/ +THC/ Ethanol ingestion Chew tobacco and endorses daily marijuana use multiple times a day. Nicotine patch offered, patient declined at this time. Reported last drink was this past weekend and drinks socially.  -monitor for signs of alcohol withdrawal -consider re offering nicotine patch  FEN/GI: Regular  diet Prophylaxis: Lovenox  Disposition: Home pending medical clearance  Subjective:  He feels ok, has his memory back and has some mild back discomfort  Objective: Temp:  [98 F (36.7 C)] 98 F (36.7 C) (04/06 2341) Pulse Rate:  [69-121] 69 (04/06 2341) Resp:  [17-24] 17 (04/06 2341) BP: (105-138)/(64-90) 119/90 (04/06 2341) SpO2:  [95 %-99 %] 98 % (04/06 2341) Weight:  [81.6 kg] 81.6 kg (04/06 1027)  Physical Exam: General: Appears unwell, no acute distress. Appears older than stated age. Multiple abrasions on the left side of the face showing signs of healing. Cardiovascular: RRR, normal heart sounds, no murmurs Respiratory: CTAB, normal effort MSK: No CVA. Diffuse hypertonic back muscularture Extremities: Moves all ext. spontaneously  Laboratory: Recent Labs  Lab 03/05/20 1145 03/06/20 0316  WBC 12.6* 11.0*  HGB 17.3* 15.3  HCT 54.4* 45.4  PLT 429* 258   Recent Labs  Lab 03/05/20 1145 03/06/20 0316  NA 141 137  K 4.1 3.6  CL 107 106  CO2 9* 22  BUN 9 14  CREATININE 1.49* 2.90*  CALCIUM 9.4 8.8*  PROT  --  6.4*  BILITOT  --  2.1*  ALKPHOS  --  79  ALT  --  29  AST  --  35  GLUCOSE 152* 105*    Imaging/Diagnostic Tests: No new imaging.  Gerlene Fee, DO 03/06/2020, 7:17 AM PGY-1, [place] Intern pager: [phone], text pages welcome

## 2020-03-06 NOTE — Progress Notes (Signed)
FPTS Interim Progress Note Updated family at bedside which was mom and sister.  Reiterated that Victor Franklin will stay another night with [place] as his CK levels are elevated.  Mom would like to be called with repeat levels at 4 PM.  Both voiced understanding and pertinent questions were answered.  Gerlene Fee, DO 03/06/2020, 3:46 PM PGY-1, [place] Medicine Service pager [phone]

## 2020-03-06 NOTE — Plan of Care (Signed)

## 2020-03-07 LAB — CBC
HCT: 43.1 % (ref 39.0–52.0)
Hemoglobin: 14.7 g/dL (ref 13.0–17.0)
MCH: 30.1 pg (ref 26.0–34.0)
MCHC: 34.1 g/dL (ref 30.0–36.0)
MCV: 88.3 fL (ref 80.0–100.0)
Platelets: 234 10*3/uL (ref 150–400)
RBC: 4.88 MIL/uL (ref 4.22–5.81)
RDW: 12 % (ref 11.5–15.5)
WBC: 8.6 10*3/uL (ref 4.0–10.5)
nRBC: 0 % (ref 0.0–0.2)

## 2020-03-07 LAB — URINALYSIS, ROUTINE W REFLEX MICROSCOPIC
Bacteria, UA: NONE SEEN
Bilirubin Urine: NEGATIVE
Glucose, UA: NEGATIVE mg/dL
Ketones, ur: NEGATIVE mg/dL
Leukocytes,Ua: NEGATIVE
Nitrite: NEGATIVE
Protein, ur: NEGATIVE mg/dL
Specific Gravity, Urine: 1.004 — ABNORMAL LOW (ref 1.005–1.030)
pH: 6 (ref 5.0–8.0)

## 2020-03-07 LAB — PROTEIN / CREATININE RATIO, URINE
Creatinine, Urine: 74.26 mg/dL
Protein Creatinine Ratio: 0.13 mg/mg{Cre} (ref 0.00–0.15)
Total Protein, Urine: 10 mg/dL

## 2020-03-07 LAB — COMPREHENSIVE METABOLIC PANEL
ALT: 24 U/L (ref 0–44)
AST: 29 U/L (ref 15–41)
Albumin: 3.2 g/dL — ABNORMAL LOW (ref 3.5–5.0)
Alkaline Phosphatase: 62 U/L (ref 38–126)
Anion gap: 7 (ref 5–15)
BUN: 16 mg/dL (ref 6–20)
CO2: 20 mmol/L — ABNORMAL LOW (ref 22–32)
Calcium: 8.3 mg/dL — ABNORMAL LOW (ref 8.9–10.3)
Chloride: 114 mmol/L — ABNORMAL HIGH (ref 98–111)
Creatinine, Ser: 4.49 mg/dL — ABNORMAL HIGH (ref 0.61–1.24)
GFR calc Af Amer: 20 mL/min — ABNORMAL LOW (ref >60)
GFR calc non Af Amer: 17 mL/min — ABNORMAL LOW (ref >60)
Glucose, Bld: 98 mg/dL (ref 70–99)
Potassium: 4 mmol/L (ref 3.5–5.1)
Sodium: 141 mmol/L (ref 135–145)
Total Bilirubin: 2 mg/dL — ABNORMAL HIGH (ref 0.3–1.2)
Total Protein: 5.9 g/dL — ABNORMAL LOW (ref 6.5–8.1)

## 2020-03-07 LAB — CK: Total CK: 893 U/L — ABNORMAL HIGH (ref 49–397)

## 2020-03-07 MED ORDER — SODIUM CHLORIDE 0.9 % IV SOLN: INTRAVENOUS | Status: DC

## 2020-03-07 NOTE — Progress Notes (Signed)
Family Medicine Teaching Service Daily Progress Note Intern Pager: [phone]  Patient name: Victor Franklin Medical record number: [id number] Date of birth: [birth year] Age: 29 y.o. Gender: male  Primary Care Provider: Caryl Bis, MD Consultants: None Code Status: FULL  Pt Overview and Major Events to Date:  4/8 Admitted  Assessment and Plan: Victor Franklin is a 29 y.o. male presenting with seizure activity. PMH is significant for tobacco use disorder, chronic marijuana smoker.   Tonic Clonic convulsions  Rhabdomyolysis, Improving. No reported seizures overnight. Patient only concern is back soreness. Likely from his fall. CK [phone] and elevated creatinine. Suspect rhabdomyolysis likely due to seizures.    -PO Keppra 500mg  BID -NS 125 mL mIVF; consider increasing with repeat CK -Tylenol PRN -f/u PM CK -f/u AM CBC, BMP -Seizure precautions -cardiac monitoring, pulse ox -vital sign per protocol  -f/u repeat EKG  AKI  Intrinsic Cr 2.90>3.86>4.49. Admission was 1.49. FENa is 4.0% suggesting post renal but more likely intrinsic and related to dehydration/hemoconcentration, starting keppra, and high dose chronic NSAID use. Patient endorses NSAID use (up to 10 800mg  tablet/day). Renal US suggest medical renal disease. -Monitor on BMP -Avoid nephrotoxic drugs  Thrombocytosis. Resolved. Plt 258. Admission 429. Thought to be due to acute state of illness -Monitor AM CBC  Multiple abrasions of left of face and left pinna  -Needs Tdap booster -Vaseline  Elevated glucose CBG 152 on admission. Now 98. Likely due to acute state of illness but will continue to monitor on AM BMP. -Monitor on AM BMP   Tobacco use disorder/ +THC/ Ethanol ingestion Chew tobacco and endorses daily marijuana use multiple times a day. Nicotine patch offered, patient declined at this time. Reported last drink was this past weekend and drinks socially.  -monitor for signs of alcohol  withdrawal -consider re offering nicotine patch  FEN/GI: Regular diet Prophylaxis: Heparin  Disposition: Home pending medical clearance  Subjective:  No concerns at this time.  Objective: Temp:  [98.2 F (36.8 C)-98.3 F (36.8 C)] 98.2 F (36.8 C) (04/07 2314) Pulse Rate:  [82-84] 82 (04/07 2314) Resp:  [14-16] 14 (04/07 2314) BP: (128-132)/(81-88) 128/81 (04/07 2314) SpO2:  [100 %] 100 % (04/07 2314)  Physical Exam:  General: Appears well, no acute distress. Age appropriate. Cardiac: RRR, normal heart sounds, no murmurs Respiratory: CTAB, normal effort Extremities: No edema or cyanosis. Skin: Warm and dry, multiple abrasions on left side of face healing Neuro: alert and oriented, no focal deficits Psych: normal affect  Laboratory: Recent Labs  Lab 03/05/20 1145 03/06/20 0316 03/07/20 0313  WBC 12.6* 11.0* 8.6  HGB 17.3* 15.3 14.7  HCT 54.4* 45.4 43.1  PLT 429* 258 234   Recent Labs  Lab 03/06/20 0316 03/06/20 1541 03/07/20 0313  NA 137 140 141  K 3.6 3.7 4.0  CL 106 110 114*  CO2 22 20* 20*  BUN 14 16 16   CREATININE 2.90* 3.86* 4.49*  CALCIUM 8.8* 8.2* 8.3*  PROT 6.4*  --  5.9*  BILITOT 2.1*  --  2.0*  ALKPHOS 79  --  62  ALT 29  --  24  AST 35  --  29  GLUCOSE 105* 105* 98    Imaging/Diagnostic Tests: No new imaging.  Gerlene Fee, DO 03/07/2020, 7:40 AM PGY-1, [place] Intern pager: [phone], text pages welcome

## 2020-03-07 NOTE — Progress Notes (Signed)
FPTS Interim Progress Note Spoke with patients mother and gave update about patient. She voiced understanding and did not have any questions at this time.  Gerlene Fee, DO 03/07/2020, 1:09 PM PGY-1, [place] Medicine Service pager [phone]

## 2020-03-07 NOTE — Consult Note (Addendum)
[facility] Renal Consultation Note  Requesting MD: Andrena Mews, MD  Indication for Consultation:  AKI   Chief complaint: Had a seizure  HPI:  Victor Franklin is a 29 y.o. male with a history of seizure disorder who presented to the hospital after multiple seizures and a fall.  He has previously had seizures but has not been on any medication for the same.  He was taking NSAIDs prior to admission - ibuprofen and BC or Goody powders approximately less than weekly.  When he used them he had been taking two 800 mg tablets at a time for headaches - used more a week or two ago.  He states that he had been eating and drinking well prior to admission and denies any nausea vomiting or diarrhea.  Does use marijuana daily he denies other drug use.  He has been hydrated while here however creatinine has risen from 1.49 on presentation ultimately to 4.49 on the date of consult.  He lost IV access this morning.  No overt hypotension here.  Renal ultrasound was performed which was negative for no hydronephrosis but echogenic kidneys bilaterally suggesting of chronic kidney disease.  CK was elevated though not markedly so and is trending down.  Work-up has also been notable for UA with Hb but no red blood cells, proteinuria on UA but UPC ratio normal at 130 mg/g, as well as glucosuria.  Strict ins and outs are not available. There is 600 ml of urine as well as 1 unmeasured void on 4/7.  Per nursing had 1000 mL this AM and another 400 mL today.  covid negative on 4/6.  His mother joined [place] via speaker phone for our interview.  We discussed that if his renal function continued to worsen that he may ultimately need dialysis and he would want this if it were indicated though would hope to avoid it.  We discussed risks benefits and indications.     Creatinine, Ser  Date/Time Value Ref Range Status  03/07/2020 03:13 AM 4.49 (H) 0.61 - 1.24 mg/dL Final  03/06/2020 03:41 PM 3.86 (H) 0.61 - 1.24 mg/dL Final   03/06/2020 03:16 AM 2.90 (H) 0.61 - 1.24 mg/dL Final    Comment:    DELTA CHECK NOTED  03/05/2020 11:45 AM 1.49 (H) 0.61 - 1.24 mg/dL Final    PMHx:   Past Medical History:  Diagnosis Date  . Seizures ([place])     He denies any history of prior surgery  Family Hx: He denies any family history of kidney disease  Social History:  reports that he has never smoked. He has never used smokeless tobacco. He reports previous alcohol use. He reports that he does not use drugs.  Allergies: No Known Allergies  Medications: Prior to Admission medications   Medication Sig Start Date End Date Taking? Authorizing Provider  ibuprofen (ADVIL) 800 MG tablet Take 1,800-2,400 mg by mouth every 8 (eight) hours as needed for headache, mild pain or moderate pain.   Yes [provider]    I have reviewed the patient's current and prior to admission medications.  Labs:  BMP Latest Ref Rng & Units 03/07/2020 03/06/2020 03/06/2020  Glucose 70 - 99 mg/dL 98 105(H) 105(H)  BUN 6 - 20 mg/dL 16 16 14   Creatinine 0.61 - 1.24 mg/dL 4.49(H) 3.86(H) 2.90(H)  Sodium 135 - 145 mmol/L 141 140 137  Potassium 3.5 - 5.1 mmol/L 4.0 3.7 3.6  Chloride 98 - 111 mmol/L 114(H) 110 106  CO2 22 -  32 mmol/L 20(L) 20(L) 22  Calcium 8.9 - 10.3 mg/dL 8.3(L) 8.2(L) 8.8(L)    Urinalysis    Component Value Date/Time   COLORURINE STRAW (A) 03/07/2020 1011   APPEARANCEUR CLEAR 03/07/2020 1011   LABSPEC 1.004 (L) 03/07/2020 1011   PHURINE 6.0 03/07/2020 1011   GLUCOSEU NEGATIVE 03/07/2020 1011   HGBUR MODERATE (A) 03/07/2020 1011   BILIRUBINUR NEGATIVE 03/07/2020 1011   KETONESUR NEGATIVE 03/07/2020 1011   PROTEINUR NEGATIVE 03/07/2020 1011   NITRITE NEGATIVE 03/07/2020 1011   LEUKOCYTESUR NEGATIVE 03/07/2020 1011     ROS:  Pertinent items noted in HPI and remainder of comprehensive ROS otherwise negative.  Physical Exam: Vitals:   03/06/20 2314 03/07/20 0807  BP: 128/81 (!) 138/95  Pulse: 82 79  Resp: 14  19  Temp: 98.2 F (36.8 C) 97.9 F (36.6 C)  SpO2: 100% 100%     General: Adult male in bed in no acute distress at rest HEENT: Normocephalic skin abrasion Eyes: Extraocular movements intact sclera anicteric Neck: Supple trachea midline Heart: S1-S2 no rub appreciated Lungs: Clear to auscultation bilaterally normal work of breathing at rest on room air Abdomen: Soft nontender nondistended normal bowel sounds Extremities: No pitting edema appreciated no cyanosis or clubbing Skin: No rash on extremities exposed Neuro: Alert and oriented x3 provides a history follows commands Psych normal mood and affect  Assessment/Plan:  # AKI, nonoliguric - Multifactorial with recent pre-renal insults, seizure as well as NSAID use.  May have some component of pigment nephropathy however CK levels mild compared with his renal dysfunction.  Continued rise is concerning and ultimately may need renal biopsy if no improvement.  May have had hypotension prior to admission.  Hopeful for plateau with supportive measures.  UP/c ratio only 130 mg/g.  Unknown baseline creatinine and creatinine 1.49 on presentation with eGFR > 60 - No acute indication for dialysis  - Would resume hydration with normal saline - have ordered - Avoid hypotension - Would avoid any NSAIDs. - Check postvoid residual bladder scan and insert Foley if retains 250 mL or more - low threshold with worsening renal function  # Seizure - Per primary team  # CKD stage 2 - Echogenic kidneys on ultrasound and unknown baseline creatinine and creatinine 1.49 on presentation with eGFR > 60 - Would avoid NSAIDs now and after discharge - With glucosuria check [id number]  # Metabolic acidosis  - currently nongap, getting fluids - defer supplementation for now; trend  # Hyperglycemia - With concurrent glucosuria check HbA1c  # Marijuana use - Counseled on importance of cessation  Victor Franklin 03/07/2020, 2:12 PM

## 2020-03-07 NOTE — Plan of Care (Signed)

## 2020-03-08 DIAGNOSIS — G40909 Epilepsy, unspecified, not intractable, without status epilepticus: Principal | ICD-10-CM

## 2020-03-08 LAB — COMPREHENSIVE METABOLIC PANEL
ALT: 23 U/L (ref 0–44)
AST: 24 U/L (ref 15–41)
Albumin: 3.2 g/dL — ABNORMAL LOW (ref 3.5–5.0)
Alkaline Phosphatase: 57 U/L (ref 38–126)
Anion gap: 10 (ref 5–15)
BUN: 20 mg/dL (ref 6–20)
CO2: 21 mmol/L — ABNORMAL LOW (ref 22–32)
Calcium: 8.3 mg/dL — ABNORMAL LOW (ref 8.9–10.3)
Chloride: 110 mmol/L (ref 98–111)
Creatinine, Ser: 4.33 mg/dL — ABNORMAL HIGH (ref 0.61–1.24)
GFR calc Af Amer: 20 mL/min — ABNORMAL LOW (ref >60)
GFR calc non Af Amer: 18 mL/min — ABNORMAL LOW (ref >60)
Glucose, Bld: 92 mg/dL (ref 70–99)
Potassium: 4.1 mmol/L (ref 3.5–5.1)
Sodium: 141 mmol/L (ref 135–145)
Total Bilirubin: 1.5 mg/dL — ABNORMAL HIGH (ref 0.3–1.2)
Total Protein: 6 g/dL — ABNORMAL LOW (ref 6.5–8.1)

## 2020-03-08 LAB — CBC
HCT: 42.5 % (ref 39.0–52.0)
Hemoglobin: 14.5 g/dL (ref 13.0–17.0)
MCH: 29.4 pg (ref 26.0–34.0)
MCHC: 34.1 g/dL (ref 30.0–36.0)
MCV: 86.2 fL (ref 80.0–100.0)
Platelets: 245 10*3/uL (ref 150–400)
RBC: 4.93 MIL/uL (ref 4.22–5.81)
RDW: 11.8 % (ref 11.5–15.5)
WBC: 6.9 10*3/uL (ref 4.0–10.5)
nRBC: 0 % (ref 0.0–0.2)

## 2020-03-08 LAB — HEMOGLOBIN A1C
Hgb A1c MFr Bld: 5.2 % (ref 4.8–5.6)
Mean Plasma Glucose: 103 mg/dL

## 2020-03-08 LAB — PHOSPHORUS: Phosphorus: 4.5 mg/dL (ref 2.5–4.6)

## 2020-03-08 MED ORDER — LEVETIRACETAM 500 MG PO TABS: 500.0000 mg | ORAL_TABLET | Freq: Two times a day (BID) | ORAL | 0 refills | Status: AC

## 2020-03-08 MED FILL — levETIRAcetam 500 MG TABS: 500 | 30 days supply | Qty: 60 | Fill #0

## 2020-03-08 NOTE — Plan of Care (Signed)

## 2020-03-08 NOTE — Progress Notes (Addendum)
Family Medicine Teaching Service Daily Progress Note Intern Pager: [phone]  Patient name: Victor Franklin Medical record number: [id number] Date of birth: 1991/11/27 Age: 29 y.o. Gender: male  Primary Care Provider: Caryl Bis, MD Consultants: None Code Status: FULL  Pt Overview and Major Events to Date:  4/8 Admitted  Assessment and Plan: Victor Franklin is a 29 y.o. male presenting with seizure activity. PMH is significant for tobacco use disorder, chronic marijuana smoker.   Tonic Clonic convulsions  Rhabdomyolysis, Improving. No reported seizures overnight. CK [phone] and elevated creatinine. Suspect rhabdomyolysis likely due to seizures.    -PO Keppra 500mg  BID -NS 125 mL mIVF -Tylenol PRN -Seizure precautions -vital sign per protocol   AKI  Intrinsic Cr 2.90>3.86>4.49>4.33. Admission was 1.49. related to dehydration/hemoconcentration, starting keppra, and high dose chronic NSAID use. Patient endorses NSAID use (up to 10 800mg  tablet/day). Renal US suggest medical renal disease. -Nephrology consulted appreciate recommendations; continue IV hydration -Monitor on BMP -Avoid nephrotoxic drugs  Thrombocytosis. Resolved. Plt 245. Admission 429.  -Monitor AM CBC  Multiple abrasions of left of face and left pinna  -Needs Tdap booster -Vaseline  Elevated glucose CBG 152 on admission. Now 92. -Monitor on AM BMP   Tobacco use disorder/ +THC/ Ethanol ingestion -monitor for signs of alcohol withdrawal -consider re offering nicotine patch  GOC -Re-establish with PCP -Send meds to TOC  FEN/GI: Regular diet Prophylaxis: Heparin  Disposition: Home pending medical clearance if can follow up with nephrology outpatient  Subjective:  No concerns at this time.   Objective: Temp:  [98.3 F (36.8 C)-98.5 F (36.9 C)] 98.5 F (36.9 C) (04/08 2221) Pulse Rate:  [71-80] 71 (04/08 2221) Resp:  [19] 19 (04/08 1611) BP: (130-134)/(83-86) 130/83 (04/08  2221) SpO2:  [100 %] 100 % (04/08 2221)  Physical Exam:  General: Appears well, no acute distress. Age appropriate. Cardiac: RRR, normal heart sounds, no murmurs Respiratory: CTAB, normal effort Neuro: alert and oriented, no focal deficits Psych: normal affect  Laboratory: Recent Labs  Lab 03/06/20 0316 03/07/20 0313 03/08/20 0113  WBC 11.0* 8.6 6.9  HGB 15.3 14.7 14.5  HCT 45.4 43.1 42.5  PLT 258 234 245   Recent Labs  Lab 03/06/20 0316 03/06/20 0316 03/06/20 1541 03/07/20 0313 03/08/20 0113  NA 137   < > 140 141 141  K 3.6   < > 3.7 4.0 4.1  CL 106   < > 110 114* 110  CO2 22   < > 20* 20* 21*  BUN 14   < > 16 16 20   CREATININE 2.90*   < > 3.86* 4.49* 4.33*  CALCIUM 8.8*   < > 8.2* 8.3* 8.3*  PROT 6.4*  --   --  5.9* 6.0*  BILITOT 2.1*  --   --  2.0* 1.5*  ALKPHOS 79  --   --  62 57  ALT 29  --   --  24 23  AST 35  --   --  29 24  GLUCOSE 105*   < > 105* 98 92   < > = values in this interval not displayed.    Imaging/Diagnostic Tests: No new imaging.  Gerlene Fee, DO 03/08/2020, 8:09 AM PGY-1, [place] Intern pager: [phone], text pages welcome

## 2020-03-08 NOTE — Progress Notes (Signed)
Followed up with Dr. Carolin Sicks, nephrology, on disposition.  He recommended monitoring for at least 1 more day to see how renal function progresses.   If creatinine declines tomorrow morning, should be stable for discharge from renal perspective to follow-up with PCP.   Patriciaann Clan, DO

## 2020-03-08 NOTE — Hospital Course (Addendum)
Victor Franklin is a 29 y.o. male presenting with seizure activity. PMH is significant for tobacco use disorder, chronic marijuana smoker. His hospital course is outline below.    Seizure Disorder Patient experienced multiple witnessed tonic clonic seizures both at work with fall that resulted in striking his head on an unknown object and multiple face abrasions. He had another witnessed seizure while in the ED. He was give 2mg  Ativan IV 1 followed by 4mg  and 1g of IV Keppra. CT head and cervical spine was negative for hemorrhage or fracture. Neurology consulted and recommended outpatient follow up yet patient altered and continued to be post-ictal and admission was indicated. Please H&P for additional admission details. He did not have anymore seizures during the rest of his hospitalization and was continued on 500mg  of Keppra twice daily.  AKI, Intrinsic 2/2 Rhabdomyolysis, Chronic NSAID use Initial CK 302 and Cr 1.49. The day following admission creatinine kinase continued to up trend to a max of 1663. Creatinine increased to a max 4.49. Patient endorsed chronic use of 800mg  ibuprofen up to 10 tablets/day. Nephrology was consulted and recommended continued IVF supportive therapy and renal ultrasound. The renal ultrasound showed echogenic kidneys likely related to medical renal disease. CK down trended to 893. At time of discharge creatinine was downward trending at 3.24.

## 2020-03-08 NOTE — Social Work (Signed)
CSW contacted by Helen and informed of a $12 prescription that patient is unable to cover. CSW used petty cash funds to cover prescription and delivered to patient bedside. Transaction documented in petty cash receipt book.  Criss Alvine, MSW, [employer]

## 2020-03-08 NOTE — Progress Notes (Addendum)
[facility] Progress Note   Subjective:   Patient was pleasant this morning with no acute complaints. Resting and watching TV. Notes he is ready to go home. Expressed understanding that our team is watching his kidney function which appears to have somewhat improved today. No need for dialysis at this time. Patient does not express any questions or concerns. He denies dizziness, headache, fatigue, SOB, CP, N/V/D/C, or dysuria.   Objective: Vitals:   03/07/20 1611 03/07/20 1900 03/07/20 2221 03/08/20 0818  BP: 134/86  130/83 134/90  Pulse: 80  71 84  Resp: 19   19  Temp: 98.3 F (36.8 C)  98.5 F (36.9 C) 98.2 F (36.8 C)  TempSrc:   Axillary   SpO2: 100% 100% 100% 100%  Weight:      Height:       Physical Exam: General: Male appearing stated age lying in hospital bed. In NAD.  Heart: RRR S1/S2 distinct. No murmurs, gallops, or rubs.  Lungs: CTA BL. No increased work of breathing.  Abdomen: Good BS. Soft, nontender, and nondistended. Extremities: No rash. Distal pulses 2+ BL. No edema.    Filed Weights   03/05/20 1027  Weight: 81.6 kg    Intake/Output Summary (Last 24 hours) at 03/08/2020 0938 Last data filed at 03/08/2020 0558 Gross per 24 hour  Intake 284.41 ml  Output 2550 ml  Net -2265.59 ml    Additional Objective Labs: Basic Metabolic Panel: Recent Labs  Lab 03/06/20 1541 03/07/20 0313 03/08/20 0113  NA 140 141 141  K 3.7 4.0 4.1  CL 110 114* 110  CO2 20* 20* 21*  GLUCOSE 105* 98 92  BUN 16 16 20   CREATININE 3.86* 4.49* 4.33*  CALCIUM 8.2* 8.3* 8.3*  PHOS  --   --  4.5   Liver Function Tests: Recent Labs  Lab 03/06/20 0316 03/07/20 0313 03/08/20 0113  AST 35 29 24  ALT 29 24 23   ALKPHOS 79 62 57  BILITOT 2.1* 2.0* 1.5*  PROT 6.4* 5.9* 6.0*  ALBUMIN 3.7 3.2* 3.2*   No results for input(s): LIPASE, AMYLASE in the last 168 hours. CBC: Recent Labs  Lab 03/05/20 1145 03/05/20 1145 03/06/20 0316 03/07/20 0313 03/08/20 0113   WBC 12.6*   < > 11.0* 8.6 6.9  NEUTROABS 9.5*  --  8.7*  --   --   HGB 17.3*   < > 15.3 14.7 14.5  HCT 54.4*   < > 45.4 43.1 42.5  MCV 92.8  --  88.3 88.3 86.2  PLT 429*   < > 258 234 245   < > = values in this interval not displayed.   Blood Culture No results found for: SDES, SPECREQUEST, CULT, REPTSTATUS  Cardiac Enzymes: Recent Labs  Lab 03/05/20 1145 03/06/20 0316 03/06/20 1541 03/07/20 0313  CKTOTAL 302 1,663* 1,486* 893*   CBG: Recent Labs  Lab 03/05/20 1147 03/05/20 1539  GLUCAP 139* 99   Iron Studies: No results for input(s): IRON, TIBC, TRANSFERRIN, FERRITIN in the last 72 hours. No results found for: INR, PROTIME Studies/Results: US RENAL  Result Date: 03/06/2020 CLINICAL DATA:  29 year old male with acute renal insufficiency. EXAM: RENAL / URINARY TRACT ULTRASOUND COMPLETE COMPARISON:  None. FINDINGS: Right Kidney: Renal measurements: 11.0 x 6.4 x 4.9 cm = volume: 180 mL. There is diffuse increased renal echogenicity. No hydronephrosis or shadowing stone. Left Kidney: Renal measurements: 12.2 x 5.6 x 4.7 cm = volume: 168 mL. Diffuse increased renal parenchymal echogenicity. No hydronephrosis or  shadowing stone. Bladder: Appears normal for degree of bladder distention. Bilateral ureteral jets noted. Other: None. IMPRESSION: Echogenic kidneys likely related to medical renal disease. No hydronephrosis or shadowing stone. Electronically Signed   By: Anner Crete M.D.   On: 03/06/2020 19:25    Medications: . sodium chloride 125 mL/hr at 03/08/20 0553   . heparin injection (subcutaneous)  5,000 Units Subcutaneous Q8H  . levETIRAcetam  500 mg Oral BID  . white petrolatum   Topical BID    Assessment and Plan: 1. AKI, intrinsic, nonoliguiric  - Contributing factors likely include heavy NSAID use for headache, rhabdomyolysis s/p tonic clonic convulsions and fall, and potential hypotension prior to admission. - CK 302 at admission. Trended up to 1663, CK down to  893 today. Cr 1.49 at admission. Trended up to 4.49. Cr improved today to 4.33.  - UP/c ratio only 130 mg/g.  Unknown baseline Cr and Cr 1.49 on presentation with eGFR > 60. - 4 L UOP yesterday. No acute indication for dialysis. Continue IVFs/supportive measures.   2. CKD stage 2 - Echogenic kidneys on [place] and unknown baseline Cr. 1.49 on presentation with eGFR > 60. - Avoid nephrotoxic medications including NSAIDs  3. Metabolic acidosis (non gap) - Improving. Continue IVF and trending. No supplementation needed at this time.   4. Hyperglycemia with glucosuria  - CBG 152 on admission. HbA1c ordered - 5.2.  - Monitor per primary team.    5. Seizure disorder - Per primary team. Started on [place].   Marisa Sprinkles, PA-S2 [facility]   Nephrology attending: Patient was seen and examined at bedside.  Chart reviewed.  I agree with the assessment and plan  by Marisa Sprinkles, PA student except as outlined below.  29 year old male with history of CKD stage II admitted with seizure disorder, now with AKI on CKD.  Patient is lying in bed comfortable.  Hemodynamically stable.  Lungs clear.  Cardiovascular regular rate rhythm S1-S2 normal.  No lower extreme edema.  No tremor or asterixis.  Patient feels good.  Urine output of 3.9 liters.  Denies nausea vomiting chest pain shortness of breath.  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  Repeat UA unremarkable.  US renal ruled out obstruction however consistent with chronic echogenicity.  Creatinine level is plateaued.  Plan to continue IV fluid and expect renal recovery.  Katheran James, MD [facility].

## 2020-03-09 DIAGNOSIS — R569 Unspecified convulsions: Secondary | ICD-10-CM

## 2020-03-09 DIAGNOSIS — T07XXXA Unspecified multiple injuries, initial encounter: Secondary | ICD-10-CM

## 2020-03-09 LAB — RENAL FUNCTION PANEL
Albumin: 3.4 g/dL — ABNORMAL LOW (ref 3.5–5.0)
Anion gap: 10 (ref 5–15)
BUN: 13 mg/dL (ref 6–20)
CO2: 23 mmol/L (ref 22–32)
Calcium: 8.5 mg/dL — ABNORMAL LOW (ref 8.9–10.3)
Chloride: 110 mmol/L (ref 98–111)
Creatinine, Ser: 3.24 mg/dL — ABNORMAL HIGH (ref 0.61–1.24)
GFR calc Af Amer: 29 mL/min — ABNORMAL LOW (ref >60)
GFR calc non Af Amer: 25 mL/min — ABNORMAL LOW (ref >60)
Glucose, Bld: 94 mg/dL (ref 70–99)
Phosphorus: 4.6 mg/dL (ref 2.5–4.6)
Potassium: 4.1 mmol/L (ref 3.5–5.1)
Sodium: 143 mmol/L (ref 135–145)

## 2020-03-09 NOTE — Discharge Instructions (Signed)
Please make a hospital follow up appointment with your PCP on Monday.   Continue taking Keppra twice daily.   Avoid ibuprofen and all NSAIDs at this time.   You will need follow up with neurology as well as nephrology.  SEIZURE CAUTIONS and WARNINGS --Per [facility] statutes, patients with seizures are not allowed to drive until they have been seizure-free for six months.   - They are to use caution when using heavy equipment or power tools.  - They are to avoid working on ladders or at heights.  - They are to take showers instead of baths.  - They are to ensure the water temperature is not too high on the home water heater.  - They are not to go swimming alone.   - When caring for infants or small children, patients with seizures are to sit down when holding, feeding, or changing them to minimize risk of injury to the child in the event having a seizure.

## 2020-03-09 NOTE — Progress Notes (Signed)
[facility] NEPHROLOGY PROGRESS NOTE  Assessment/ Plan: Pt is a 29 y.o. yo male with history of CKD stage II admitted with seizure disorder seen as a consultation for AKI on CKD.  #Acute kidney injury on CKD, nonoliguric likely due to pigment nephropathy due to rhabdomyolysis concomitant with the use of NSAIDs.  Repeat urinalysis unremarkable.  US renal ruled out obstruction however consistent with chronic echogenicity. With IV hydration patient has increased urination and creatinine level trending down.  I expect renal recovery to his baseline.  Discontinue IV fluid.   #Seizure disorder: Currently on Keppra.  Per primary team.  #Metabolic acidosis: Improved.  Okay to discharge from renal perspective.  I will arrange outpatient follow-up at [facility].  Discussed with the patient.  Subjective: Seen and examined at bedside.  Denies headache, dizziness, nausea vomiting chest pain shortness of breath.  Creatinine level trending down.  No new event. Objective Vital signs in last 24 hours: Vitals:   03/08/20 0818 03/08/20 1708 03/08/20 2031 03/09/20 0742  BP: 134/90 (!) 130/102 (!) 136/96 (!) 130/95  Pulse: 84 87 69 81  Resp: 19 19 16 18   Temp: 98.2 F (36.8 C) 98 F (36.7 C) 97.8 F (36.6 C) 98.2 F (36.8 C)  TempSrc:    Oral  SpO2: 100% 100% 100% 100%  Weight:      Height:       Weight change:   Intake/Output Summary (Last 24 hours) at 03/09/2020 0915 Last data filed at 03/09/2020 0700 Gross per 24 hour  Intake --  Output 1150 ml  Net -1150 ml       Labs: Basic Metabolic Panel: Recent Labs  Lab 03/07/20 0313 03/08/20 0113 03/09/20 0217  NA 141 141 143  K 4.0 4.1 4.1  CL 114* 110 110  CO2 20* 21* 23  GLUCOSE 98 92 94  BUN 16 20 13   CREATININE 4.49* 4.33* 3.24*  CALCIUM 8.3* 8.3* 8.5*  PHOS  --  4.5 4.6   Liver Function Tests: Recent Labs  Lab 03/06/20 0316 03/06/20 0316 03/07/20 0313 03/08/20 0113 03/09/20 0217  AST 35  --  29 24   --   ALT 29  --  24 23  --   ALKPHOS 79  --  62 57  --   BILITOT 2.1*  --  2.0* 1.5*  --   PROT 6.4*  --  5.9* 6.0*  --   ALBUMIN 3.7   < > 3.2* 3.2* 3.4*   < > = values in this interval not displayed.   No results for input(s): LIPASE, AMYLASE in the last 168 hours. No results for input(s): AMMONIA in the last 168 hours. CBC: Recent Labs  Lab 03/05/20 1145 03/05/20 1145 03/06/20 0316 03/07/20 0313 03/08/20 0113  WBC 12.6*   < > 11.0* 8.6 6.9  NEUTROABS 9.5*  --  8.7*  --   --   HGB 17.3*   < > 15.3 14.7 14.5  HCT 54.4*   < > 45.4 43.1 42.5  MCV 92.8  --  88.3 88.3 86.2  PLT 429*   < > 258 234 245   < > = values in this interval not displayed.   Cardiac Enzymes: Recent Labs  Lab 03/05/20 1145 03/06/20 0316 03/06/20 1541 03/07/20 0313  CKTOTAL 302 1,663* 1,486* 893*   CBG: Recent Labs  Lab 03/05/20 1147 03/05/20 1539  GLUCAP 139* 99    Iron Studies: No results for input(s): IRON, TIBC, TRANSFERRIN, FERRITIN in the last  72 hours. Studies/Results: No results found.  Medications: Infusions: . sodium chloride 125 mL/hr at 03/09/20 0507    Scheduled Medications: . heparin injection (subcutaneous)  5,000 Units Subcutaneous Q8H  . levETIRAcetam  500 mg Oral BID  . white petrolatum   Topical BID    have reviewed scheduled and prn medications.  Physical Exam: General:NAD, comfortable Heart:RRR, s1s2 nl Lungs:clear b/l, no crackle Abdomen:soft, Non-tender, non-distended Extremities:No edema Neurology: Alert, oriented, no asterixis.  Cathy Ropp Prasad Leonie Amacher 03/09/2020,9:15 AM  LOS: 3 days  Pager: [phone]

## 2020-03-09 NOTE — Discharge Summary (Signed)
[facility] Discharge Summary  Patient name: Victor Franklin Medical record number: [id number] Date of birth: 10-23-1991 Age: 29 y.o. Gender: male Date of Admission: 03/05/2020  Date of Discharge: 03/09/20 Admitting Physician: Gerlene Fee, DO  Primary Care Provider: Caryl Bis, MD Consultants: Neurology, Nephrology  Indication for Hospitalization: Witness seizure, post-ictal  Discharge Diagnoses/Problem List:  Seizure Disorder Tonic Clonic Convulsions Rhabdomyolysis AKI Thrombocytosis Multiple facial abrasions Elevated glucose Tobacco use disorder THC use disorder  Disposition: Home  Discharge Condition: Stable  Discharge Exam:   General: Appears well, no acute distress. Age appropriate. Sitting up in bed.  Cardiac: RRR, normal heart sounds, no murmurs Respiratory: CTAB, normal effort Extremities: No edema or cyanosis. Skin: Warm and dry, multiple scabs of healing wounds on left side of face. No signs of infection. Neuro: alert and oriented, no focal deficits Psych: normal affect  Brief Hospital Course:  Edher Ging is a 29 y.o. male presenting with seizure activity. PMH is significant for tobacco use disorder, chronic marijuana smoker. His hospital course is outline below.    Seizure Disorder Patient experienced multiple witnessed tonic clonic seizures both at work with fall that resulted in striking his head on an unknown object and multiple face abrasions. He had another witnessed seizure while in the ED. He was give 2mg  Ativan IV 1 followed by 4mg  and 1g of IV Keppra. CT head and cervical spine was negative for hemorrhage or fracture. Neurology consulted and recommended outpatient follow up yet patient altered and continued to be post-ictal and admission was indicated. Please H&P for additional admission details. He did not have anymore seizures during the rest of his hospitalization and was continued on 500mg  of Keppra twice  daily.  AKI, Intrinsic 2/2 Rhabdomyolysis, Chronic NSAID use Initial CK 302 and Cr 1.49. The day following admission creatinine kinase continued to up trend to a max of 1663. Creatinine increased to a max 4.49. Patient endorsed chronic use of 800mg  ibuprofen up to 10 tablets/day. Nephrology was consulted and recommended continued IVF supportive therapy and renal ultrasound. The renal ultrasound showed echogenic kidneys likely related to medical renal disease. CK down trended to 893. At time of discharge creatinine was downward trending at 3.24.   Issues for Follow Up:  1. Needs outpatient follow up with Neurology; started Keppra 500mg  twice daily. 2. Repeat renal function panel to ensure creatinine continues to improve; consider Nephrology outpatient.  3. Needs to continue to avoid NSAID use.  4. Patients with seizures are not allowed to drive until they have been seizure-free for six months. 5. Monitor BP outpatient; elevated blood pressure during admission.   Significant Procedures: N/A  Significant Labs and Imaging:  Recent Labs  Lab 03/06/20 0316 03/07/20 0313 03/08/20 0113  WBC 11.0* 8.6 6.9  HGB 15.3 14.7 14.5  HCT 45.4 43.1 42.5  PLT 258 234 245   Recent Labs  Lab 03/06/20 0316 03/06/20 0316 03/06/20 1541 03/06/20 1541 03/07/20 0313 03/07/20 0313 03/08/20 0113 03/09/20 0217  NA 137  --  140  --  141  --  141 143  K 3.6   < > 3.7   < > 4.0   < > 4.1 4.1  CL 106  --  110  --  114*  --  110 110  CO2 22  --  20*  --  20*  --  21* 23  GLUCOSE 105*  --  105*  --  98  --  92 94  BUN 14  --  16  --  16  --  20 13  CREATININE 2.90*  --  3.86*  --  4.49*  --  4.33* 3.24*  CALCIUM 8.8*  --  8.2*  --  8.3*  --  8.3* 8.5*  PHOS  --   --   --   --   --   --  4.5 4.6  ALKPHOS 79  --   --   --  62  --  57  --   AST 35  --   --   --  29  --  24  --   ALT 29  --   --   --  24  --  23  --   ALBUMIN 3.7  --   --   --  3.2*  --  3.2* 3.4*   < > = values in this interval not  displayed.   CT HEAD WITHOUT CONTRAST CT CERVICAL SPINE WITHOUT CONTRAST COMPARISON:  Head CT report 03/18/2013 IMPRESSION: No evidence of intracranial or cervical spine injury.  RENAL / URINARY TRACT ULTRASOUND COMPLETE COMPARISON:  None. IMPRESSION: Echogenic kidneys likely related to medical renal disease. No hydronephrosis or shadowing stone.  Results/Tests Pending at Time of Discharge: N/A  Discharge Medications:  Allergies as of 03/09/2020   No Known Allergies     Medication List    STOP taking these medications   ibuprofen 800 MG tablet Commonly known as: ADVIL     TAKE these medications   levETIRAcetam 500 MG tablet Commonly known as: KEPPRA Take 1 tablet (500 mg total) by mouth 2 (two) times daily.       Discharge Instructions: Please refer to Patient Instructions section of EMR for full details.  Patient was counseled important signs and symptoms that should prompt return to medical care, changes in medications, dietary instructions, activity restrictions, and follow up appointments.   Follow-Up Appointments:  No future appointments.  Gerlene Fee, DO 03/09/2020, 11:53 AM PGY-1, [place]

## 2021-10-20 IMAGING — CT CT HEAD W/O CM
3 series · 15 of 47 positions shown, 18 images · non-contrast
Comparison: Head CT report 03/18/2013

CLINICAL DATA: Acute seizure.  History of trauma.

EXAM:
CT HEAD WITHOUT CONTRAST
CT CERVICAL SPINE WITHOUT CONTRAST
TECHNIQUE: Multidetector CT imaging of the head and cervical spine was
performed following the standard protocol without intravenous
contrast. Multiplanar CT image reconstructions of the cervical spine
were also generated.

[Series 2: head 5.0 h30s · axial · 0.47mm/px · z∈[-99,+46]mm · 9 of 35 slices shown, 12 images]
[im 3/35  brain]
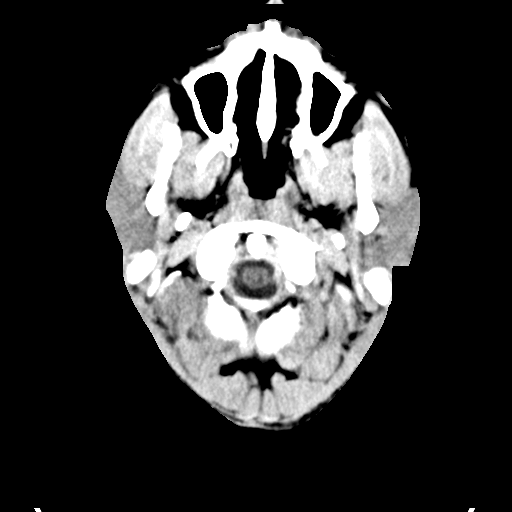
[im 3/35  bone]
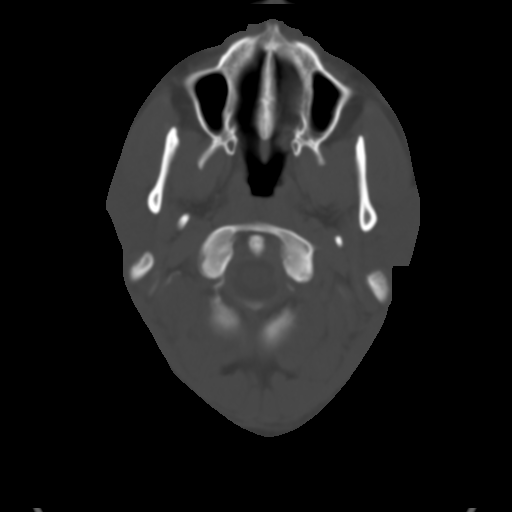
[im 6/35  brain]
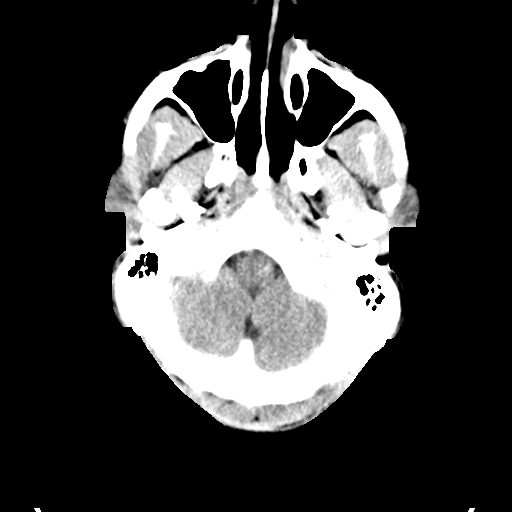
[im 10/35  brain]
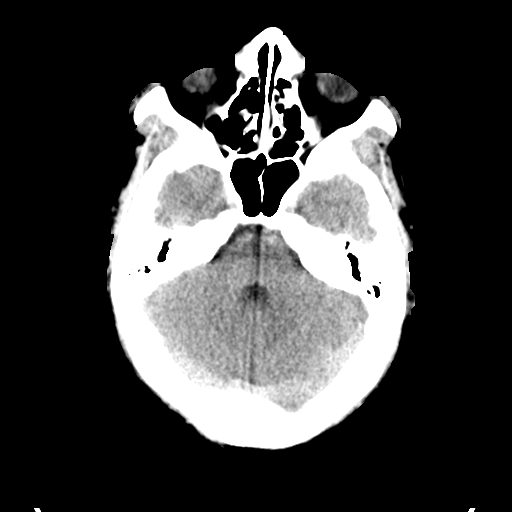
[im 13/35  brain]
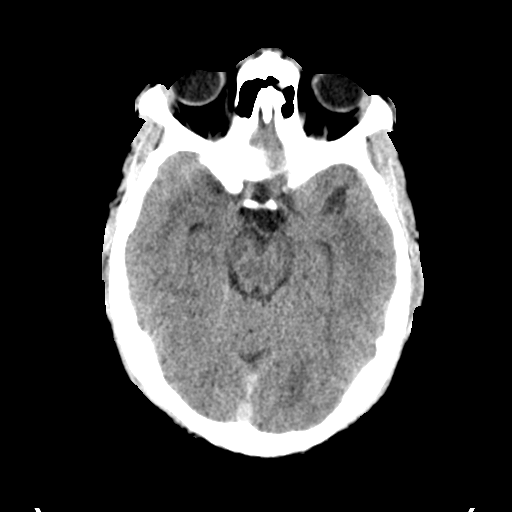
[im 18/35  brain]
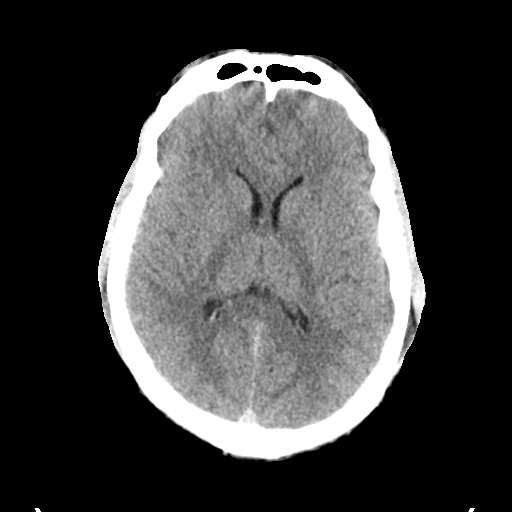
[im 18/35  bone]
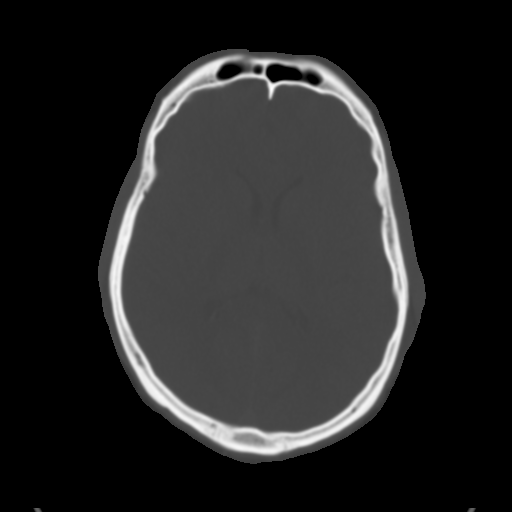
[im 22/35  brain]
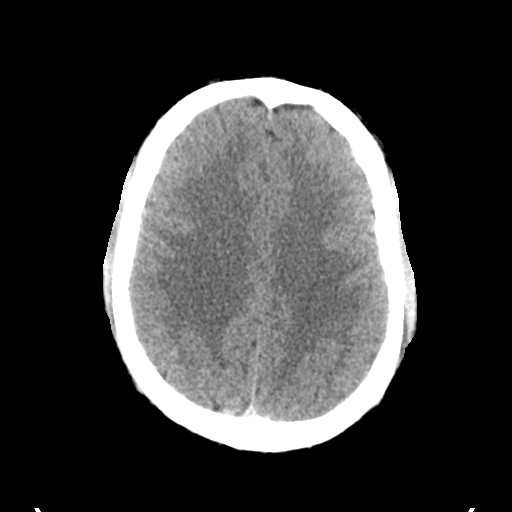
[im 25/35  brain]
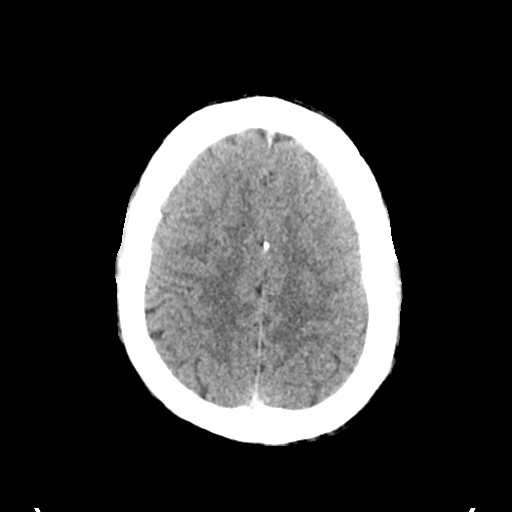
[im 29/35  brain]
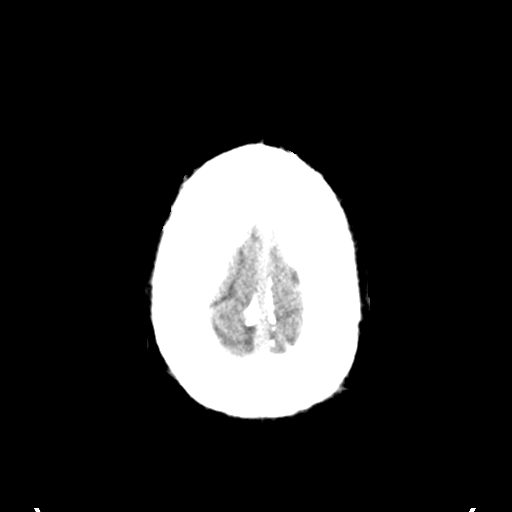
[im 32/35  brain]
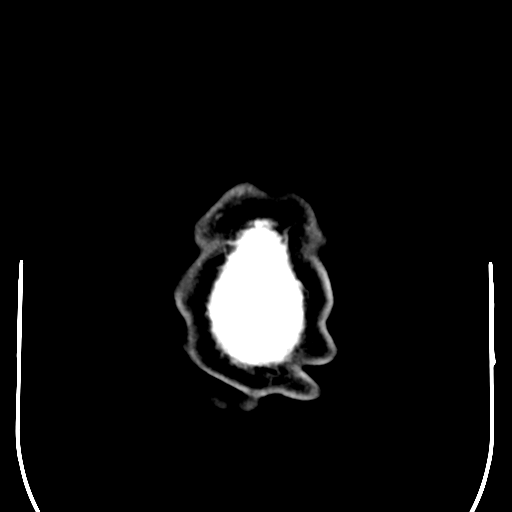
[im 32/35  bone]
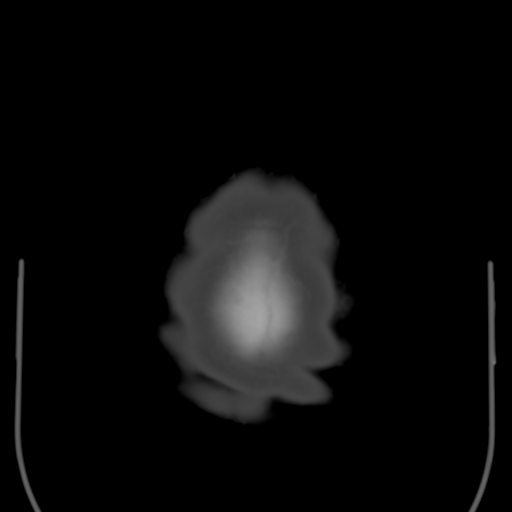

[Series 4: head 3.0 mpr cor · coronal · 0.37mm/px · 3 of 74 slices shown]
[im 25/74  brain]
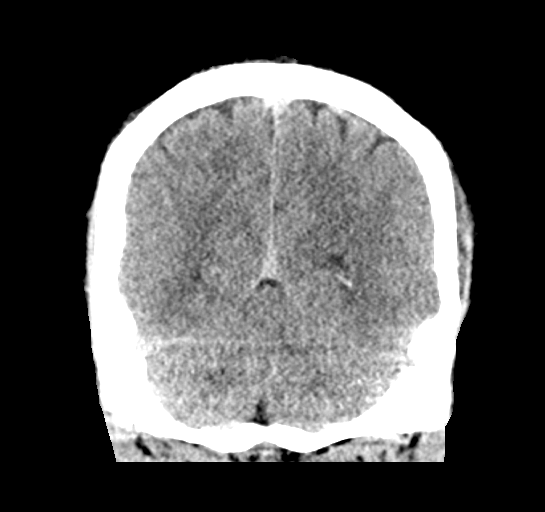
[im 33/74  brain]
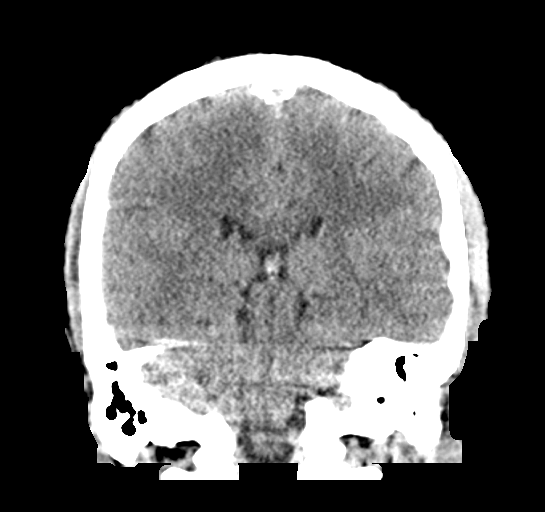
[im 41/74  brain]
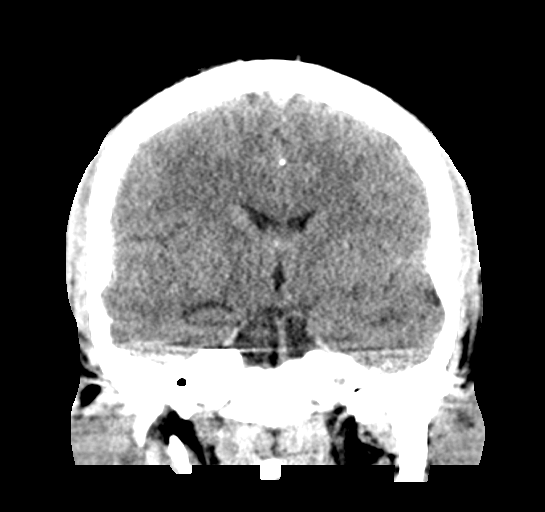

[Series 5: head 3.0 mpr sag · sagittal · 0.34mm/px · 3 of 66 slices shown]
[im 22/66  brain]
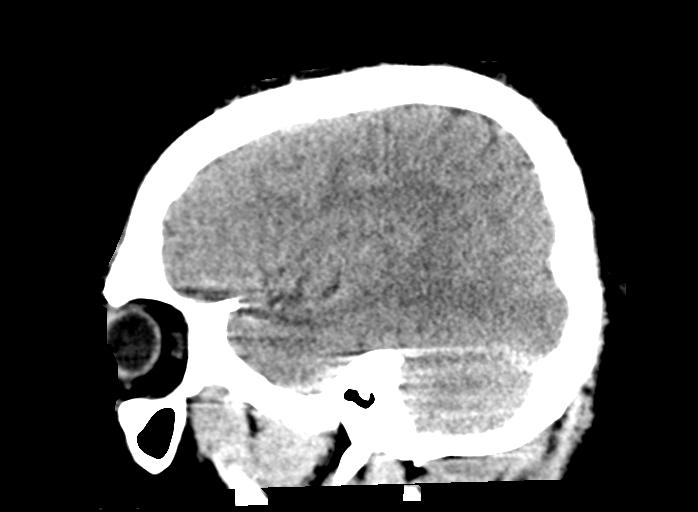
[im 33/66  brain]
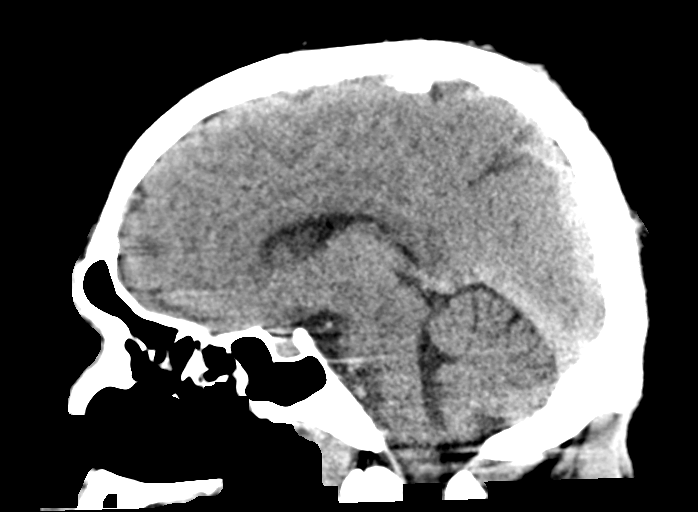
[im 44/66  brain]
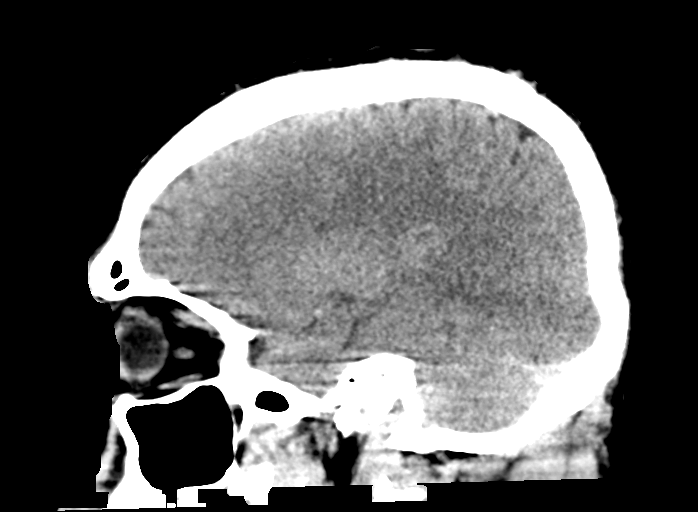

[15 of 47 positions shown; findings below may reference images not displayed]

FINDINGS: CT HEAD FINDINGS

Brain: No evidence of acute infarction, hemorrhage, hydrocephalus,
extra-axial collection or mass lesion/mass effect.

Vascular: No hyperdense vessel or unexpected calcification.

Skull: Normal. Negative for fracture or focal lesion.

Sinuses/Orbits: No acute finding.

CT CERVICAL SPINE FINDINGS

Alignment: Normal.

Skull base and vertebrae: No acute fracture. No primary bone lesion
or focal pathologic process.

Soft tissues and spinal canal: No prevertebral fluid or swelling. No
visible canal hematoma.

Disc levels:  No degenerative changes

Upper chest: Negative

Other: Carious left posterior molar with periapical erosion.
IMPRESSION: No evidence of intracranial or cervical spine injury.
